# Patient Record
Sex: Female | Born: 1990 | Race: White | Hispanic: No | Marital: Single | Smoking: Current every day smoker
Health system: Southern US, Community
[De-identification: ages and names within clinical notes are randomized; demographics above are authoritative.]

## PROBLEM LIST (undated history)

## (undated) DIAGNOSIS — A4902 Methicillin resistant Staphylococcus aureus infection, unspecified site: Secondary | ICD-10-CM

## (undated) DIAGNOSIS — Z9851 Tubal ligation status: Secondary | ICD-10-CM

## (undated) DIAGNOSIS — O2300 Infections of kidney in pregnancy, unspecified trimester: Secondary | ICD-10-CM

## (undated) DIAGNOSIS — N390 Urinary tract infection, site not specified: Secondary | ICD-10-CM

## (undated) DIAGNOSIS — N12 Tubulo-interstitial nephritis, not specified as acute or chronic: Secondary | ICD-10-CM

## (undated) DIAGNOSIS — Z3400 Encounter for supervision of normal first pregnancy, unspecified trimester: Principal | ICD-10-CM

## (undated) DIAGNOSIS — F39 Unspecified mood [affective] disorder: Secondary | ICD-10-CM

## (undated) DIAGNOSIS — F419 Anxiety disorder, unspecified: Secondary | ICD-10-CM

## (undated) HISTORY — PX: CYSTOSCOPY: SUR368

## (undated) HISTORY — DX: Encounter for supervision of normal first pregnancy, unspecified trimester: Z34.00

---
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
## 2011-01-01 LAB — ABO/RH: RH Type: POSITIVE

## 2011-01-01 LAB — HIV ANTIBODY (ROUTINE TESTING W REFLEX): HIV: NONREACTIVE

## 2011-01-01 LAB — RUBELLA ANTIBODY, IGM: Rubella: IMMUNE

## 2011-01-15 ENCOUNTER — Inpatient Hospital Stay (HOSPITAL_COMMUNITY)
Admission: AD | Admit: 2011-01-15 | Discharge: 2011-01-17 | DRG: 781 | Disposition: A | Discharge status: Home / Self Care | Payer: Medicaid Other | Source: Ambulatory Visit | Attending: Obstetrics and Gynecology | Admitting: Obstetrics and Gynecology

## 2011-01-15 ENCOUNTER — Other Ambulatory Visit: Payer: Self-pay | Admitting: Obstetrics and Gynecology

## 2011-01-15 DIAGNOSIS — T370X5A Adverse effect of sulfonamides, initial encounter: Secondary | ICD-10-CM | POA: Diagnosis not present

## 2011-01-15 DIAGNOSIS — O2390 Unspecified genitourinary tract infection in pregnancy, unspecified trimester: Principal | ICD-10-CM | POA: Diagnosis present

## 2011-01-15 DIAGNOSIS — L500 Allergic urticaria: Secondary | ICD-10-CM | POA: Diagnosis not present

## 2011-01-15 DIAGNOSIS — N12 Tubulo-interstitial nephritis, not specified as acute or chronic: Secondary | ICD-10-CM | POA: Diagnosis present

## 2011-01-15 DIAGNOSIS — O2300 Infections of kidney in pregnancy, unspecified trimester: Secondary | ICD-10-CM

## 2011-01-15 LAB — CBC
MCH: 31.8 pg (ref 26.0–34.0)
MCHC: 35.8 g/dL (ref 30.0–36.0)
MCV: 88.9 fL (ref 78.0–100.0)
Platelets: 211 10*3/uL (ref 150–400)
RDW: 11.6 % (ref 11.5–15.5)

## 2011-01-15 LAB — BASIC METABOLIC PANEL
Calcium: 8.8 mg/dL (ref 8.4–10.5)
Creatinine, Ser: 0.47 mg/dL — ABNORMAL LOW (ref 0.50–1.10)
GFR calc Af Amer: >60 mL/min (ref >60)
GFR calc non Af Amer: >60 mL/min (ref >60)
Sodium: 135 mEq/L (ref 135–145)

## 2011-01-15 MED ORDER — SODIUM CHLORIDE 0.9 % IV SOLN
8.0000 mg | Freq: Three times a day (TID) | INTRAVENOUS | Status: DC | PRN
  Filled 2011-01-15: qty 4

## 2011-01-15 MED ORDER — ONDANSETRON HCL 4 MG PO TABS: 8.0000 mg | ORAL_TABLET | Freq: Three times a day (TID) | ORAL | Status: DC | PRN

## 2011-01-15 MED ORDER — MENTHOL 3 MG MT LOZG: 1.0000 | LOZENGE | OROMUCOSAL | Status: DC | PRN

## 2011-01-15 MED ORDER — OXYCODONE-ACETAMINOPHEN 5-325 MG PO TABS
1.0000 | ORAL_TABLET | ORAL | Status: DC | PRN
  Administered 2011-01-15: 1 via ORAL
  Administered 2011-01-16 (×2): 2 via ORAL
  Administered 2011-01-16: 1 via ORAL
  Administered 2011-01-16: 2 via ORAL
  Administered 2011-01-16 (×3): 1 via ORAL
  Filled 2011-01-15: qty 1
  Filled 2011-01-15 (×2): qty 2
  Filled 2011-01-15: qty 1
  Filled 2011-01-15: qty 2
  Filled 2011-01-15 (×2): qty 1

## 2011-01-15 MED ORDER — ALUM & MAG HYDROXIDE-SIMETH 200-200-20 MG/5ML PO SUSP
30.0000 mL | ORAL | Status: DC | PRN
  Filled 2011-01-15: qty 30

## 2011-01-15 MED ORDER — GENTAMICIN SULFATE 40 MG/ML IJ SOLN
Freq: Three times a day (TID) | INTRAVENOUS | Status: DC
  Administered 2011-01-15 – 2011-01-17 (×5): via INTRAVENOUS
  Filled 2011-01-15 (×7): qty 3.25

## 2011-01-15 MED ORDER — CLINDAMYCIN PHOSPHATE 900 MG/50ML IV SOLN
900.0000 mg | Freq: Three times a day (TID) | INTRAVENOUS | Status: DC
  Filled 2011-01-15: qty 50

## 2011-01-15 MED ORDER — LACTATED RINGERS IV SOLN
INTRAVENOUS | Status: DC
  Administered 2011-01-15 – 2011-01-17 (×5): via INTRAVENOUS

## 2011-01-15 MED ORDER — DOCUSATE SODIUM 100 MG PO CAPS
100.0000 mg | ORAL_CAPSULE | Freq: Every day | ORAL | Status: DC
  Administered 2011-01-15 – 2011-01-17 (×4): 100 mg via ORAL
  Filled 2011-01-15 (×4): qty 1

## 2011-01-15 MED ORDER — SIMETHICONE 80 MG PO CHEW: 80.0000 mg | CHEWABLE_TABLET | Freq: Four times a day (QID) | ORAL | Status: DC | PRN

## 2011-01-15 MED ORDER — GUAIFENESIN 100 MG/5ML PO SOLN: 15.0000 mL | ORAL | Status: DC | PRN

## 2011-01-15 MED ORDER — ZOLPIDEM TARTRATE 5 MG PO TABS: 5.0000 mg | ORAL_TABLET | Freq: Every evening | ORAL | Status: DC | PRN

## 2011-01-15 NOTE — H&P (Signed)
[name] [name] is an 20 y.o. female. G1P0 at 15+3 with likely pyelonephritis.  No LOF,no VB, no ctx; c/o flank pain, chills, nausea, vomiting, weight loss.  Pt afebrile in office. Also c/o HA.    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 Augmentin Hives and Swelling  . Penicillins Hives and Swelling  SH: denies tobacco, etoh, drug use; single, GED FH: Alcohol, Arthritis, Bipolar, Cervical CA, Depression  Review of Systems  Constitutional: Negative.   HENT: Negative.   Respiratory: Negative.   Cardiovascular: Negative.   Gastrointestinal: Positive for nausea, vomiting and abdominal pain.  Genitourinary: Positive for dysuria, urgency, frequency and flank pain.  Musculoskeletal: Positive for myalgias.  Skin: Negative.   Neurological: Positive for dizziness.       Headache    AF VSS Physical Exam  Constitutional: She is oriented to person, place, and time. She appears well-developed and well-nourished.       Ill appearing  HENT:  Head: Normocephalic and atraumatic.  Eyes: Pupils are equal, round, and reactive to light.  Neck: Normal range of motion. Neck supple.  Cardiovascular: Regular rhythm.   Respiratory: Effort normal and breath sounds normal.  GI: Soft. Bowel sounds are normal. She exhibits no distension. There is no tenderness.  Neurological: She is alert and oriented to person, place, and time.  Skin: Skin is warm and dry.  Psychiatric: She has a normal mood and affect.     Assessment/Plan: [name] with pyelo. Admit for abx - gentamicin and clindamycin (secondary to PCN allergy) Daily FHTs Pain med for HA Nausea med for N/V   [name] [name] 01/15/2011, 5:38 PM

## 2011-01-15 NOTE — Consult Note (Signed)
ANTIBIOTIC CONSULT NOTE - INITIAL  Pharmacy Consult for gentamicin Indication: pyelonephritis  Patient Measurements: Height: 5\' 3"  (160 cm) Weight: 113 lb (51.256 kg) IBW/kg (Calculated) : 52.4    Vital Signs: Temp: 98.4 F (36.9 C) (08/03 1900) Temp src: Oral (08/03 1900) BP: 108/71 mmHg (08/03 1900) Pulse Rate: 93  (08/03 1900) Intake/Output from this shift:  Not charted  Labs:  [facility] 01/15/11 2033  WBC 8.0  HGB 12.0  PLT 211  LABCREA --  CREATININE 0.47*  CRCLEARANCE --   No results found for this basename: VANCOTROUGH:2,VANCOPEAK:2,VANCORANDOM:2,GENTTROUGH:2,GENTPEAK:2,GENTRANDOM:2,TOBRATROUGH:2,TOBRAPEAK:2,TOBRARND:2,AMIKACINPEAK:2,AMIKACINTROU:2,AMIKACIN:2, in the last 72 hours   Microbiology: No results found for this or any previous visit (from the past 720 hour(s)).  Medical History: Anxiety, mood disorder, h/o pyelonephritis, agenesis of ureter- surgically repaired DOL 3.   Medications:  Clindamycin 900 mg IV Q8H  Assessment: 20 yo,  [redacted] weeks pregnant, likely pyelonephritis.   Goal of Therapy:  Gentamicin goal peak = 6-8 and trough <1  Plan:  Gentamicin 130mg  IV Q8H.  Will align schedule with clindamycin to be given together.   [name] [name] 01/15/2011,9:20 PM

## 2011-01-16 NOTE — Progress Notes (Signed)
Subjective: Patient reports feeling some better.  Her HA has resolved with Percocet, sounds like a migraine triggered from dehydration. Tolerating small amounts of po intake with no N/V. Minimal back pain.  Still occasional chills.    Objective: I have reviewed patient's vital signs and labs.  General: alert Resp: clear to auscultation bilaterally Cardio: regular rate and rhythm GI: soft, non-tender; bowel sounds normal; no masses,  no organomegaly Vaginal Bleeding: none   Assessment/Plan: Pt 15+ weeks gestation with presumed pyelonephritis by clinical presentation.  On gent and clinda day 1/2.  No fever noted,  WBC WNL.  Was under treatment for UTI with Bactrim, developed hives on that.  Creatinine WNL.  Will continue full 24 hours of Abx, if clinically improved, plan d/c tomorrow.  Cx done in office still pending.  LOS: 1 day    Gio Janoski W 01/16/2011, 10:09 AM

## 2011-01-17 LAB — URINE CULTURE
Colony Count: 10000
Culture  Setup Time: 201208040451

## 2011-01-17 MED ORDER — OXYCODONE-ACETAMINOPHEN 5-325 MG PO TABS
1.0000 | ORAL_TABLET | Freq: Four times a day (QID) | ORAL | Status: AC | PRN
Start: 2011-01-17 — End: 2011-01-27

## 2011-01-17 MED ORDER — NITROFURANTOIN MONOHYD MACRO 100 MG PO CAPS: 100.0000 mg | ORAL_CAPSULE | Freq: Two times a day (BID) | ORAL | Status: AC

## 2011-01-17 NOTE — Progress Notes (Addendum)
Subjective: Patient reports feeling some better.  Still has some HA and back pain for which she is taking Percocet prn.  Tolerated po Intake yesterday with one episode of emesis after took percocet on empty stomach      Objective: I have reviewed patient's vital signs, labs and microbiology.  She has had no fever since admission and white count has been WNL. Her hospital urine cx showed mixed bacteria, 16109 colonies (but had been on Bactrim)  General: alert Resp: clear to auscultation bilaterally Cardio: regular rate and rhythm GI: soft, non-tender; bowel sounds normal; no masses,  no organomegaly   Assessment/Plan: Pt now s/p more than 24 hours of gent/clinda with no fever and no WBC elevation.  Still states does not feel totally well, So will change to po Macrobid and d/c home on that.  Advised to wean her percocet.  Will send her home with 15 only.   LOS: 2 days    Maxton Noreen W 01/17/2011, 9:16 AM

## 2011-01-17 NOTE — Discharge Summary (Signed)
Physician Discharge Summary  Patient ID: [name] [name] MRN: [id number] DOB/AGE: [birth year] 19 y.o.  Admit date: 01/15/2011 Discharge date: 01/17/2011  Admission Diagnoses: [redacted] weeks gestation                                         Clinical pyelonephritis with h/o urological surgery  Discharge Diagnoses: same   Discharged Condition: good  Hospital Course: Pt was admitted from office with presumed pyelonephritis c/o chills and CVA pain.  Had been on Bactrim for a UTI for 2 days prior to admission  But still did not feel well with decreased po intake and HA.  Also developed hives on Bactrim.  Admitted with over 24 hours of gentamicin and clindamycin.  Never had a temperature in hospital. WBC and culture WNL.  On day of d/c pt still c/o some back pain and HA, but controlled with po percocet, and weaning those.   Treatments: IV antibiotic therapy (gentamicin and clindamycin)  Discharge Exam: Blood pressure 94/56, pulse 79, temperature 98.9 F (37.2 C), temperature source Oral, resp. rate 18, height 5\' 3"  (1.6 m), weight 51.256 kg (113 lb), SpO2 100.00%. General appearance: alert Cardio: regular rate and rhythm GI: soft, non-tender; bowel sounds normal; no masses,  no organomegaly  Disposition: Home or Self Care  Discharge Orders    Future Orders Please Complete By Expires   Diet - low sodium heart healthy      Increase activity slowly      Discharge instructions      Comments:   Continue to drink fluids and eat small frequent meals.   May shower / Bathe      Call MD for:  temperature >100.4      Call MD for:  severe uncontrolled pain      Call MD for:  persistant nausea and vomiting        Current Discharge Medication List    CONTINUE these medications which have NOT CHANGED   Details  acetaminophen (TYLENOL) 500 MG tablet Take 1,000 mg by mouth every 6 (six) hours as needed.      hydrocortisone 1 % cream Apply 1 application topically 2 (two) times daily. As needed for rash      prenatal vitamin w/FE, FA (PRENATAL 1 + 1) 27-1 MG TABS Take 1 tablet by mouth daily.      sulfamethoxazole-trimethoprim (BACTRIM DS) 800-160 MG per tablet Take 1 tablet by mouth 2 (two) times daily. Take for 7 days, started yesterday        Follow-up Information    Follow up with BOVARD,JODY, MD in 1 week. (call for appt)    Contact information:   [address] [facility] [address] [place] [address] [phone]          Signed: Oliver Pila 01/17/2011, 9:39 AM

## 2011-03-11 LAB — SALICYLATE LEVEL: Salicylate Lvl: <4

## 2011-03-11 LAB — POCT I-STAT, CHEM 8
BUN: 14
Calcium, Ion: 1.17
Chloride: 103
Chloride: 104
Glucose, Bld: 92
HCT: 46 — ABNORMAL HIGH
Potassium: 4.1
Potassium: 4.4
Sodium: 138

## 2011-03-11 LAB — COMPREHENSIVE METABOLIC PANEL
AST: 20
Albumin: 4.7
BUN: 16
Chloride: 102
Glucose, Bld: 94
Potassium: 4.4
Sodium: 138

## 2011-03-11 LAB — WET PREP, GENITAL: Yeast Wet Prep HPF POC: NONE SEEN

## 2011-03-11 LAB — ACETAMINOPHEN LEVEL
Acetaminophen (Tylenol), Serum: <10 — ABNORMAL LOW
Acetaminophen (Tylenol), Serum: <10 — ABNORMAL LOW

## 2011-03-11 LAB — URINALYSIS, ROUTINE W REFLEX MICROSCOPIC
Hgb urine dipstick: NEGATIVE
Nitrite: NEGATIVE
Protein, ur: NEGATIVE
Specific Gravity, Urine: 1.01
Urobilinogen, UA: 0.2

## 2011-03-11 LAB — URINE CULTURE: Culture: NO GROWTH

## 2011-03-11 LAB — CBC
HCT: 45 — ABNORMAL HIGH
Hemoglobin: 15.2 — ABNORMAL HIGH
RBC: 4.92
WBC: 6.4

## 2011-03-11 LAB — DIFFERENTIAL
Eosinophils Relative: 4
Lymphocytes Relative: 52
Lymphs Abs: 3.3

## 2011-03-11 LAB — RAPID URINE DRUG SCREEN, HOSP PERFORMED
Cocaine: NOT DETECTED
Opiates: POSITIVE — AB
Tetrahydrocannabinol: POSITIVE — AB
Tetrahydrocannabinol: POSITIVE — AB

## 2011-03-11 LAB — POCT PREGNANCY, URINE
Operator id: 294511
Preg Test, Ur: NEGATIVE

## 2011-04-20 ENCOUNTER — Emergency Department (HOSPITAL_COMMUNITY)
Admission: EM | Admit: 2011-04-20 | Discharge: 2011-04-20 | Discharge status: Against Medical Advice | Payer: Medicaid Other | Attending: Emergency Medicine | Admitting: Emergency Medicine

## 2011-04-20 DIAGNOSIS — M25529 Pain in unspecified elbow: Secondary | ICD-10-CM | POA: Insufficient documentation

## 2011-05-31 ENCOUNTER — Encounter (HOSPITAL_COMMUNITY): Payer: Self-pay

## 2011-05-31 ENCOUNTER — Inpatient Hospital Stay (HOSPITAL_COMMUNITY)
Admission: AD | Admit: 2011-05-31 | Discharge: 2011-05-31 | Disposition: A | Discharge status: Home / Self Care | Payer: Medicaid Other | Source: Ambulatory Visit | Attending: Obstetrics and Gynecology | Admitting: Obstetrics and Gynecology

## 2011-05-31 DIAGNOSIS — J029 Acute pharyngitis, unspecified: Secondary | ICD-10-CM | POA: Insufficient documentation

## 2011-05-31 DIAGNOSIS — Z3480 Encounter for supervision of other normal pregnancy, unspecified trimester: Secondary | ICD-10-CM

## 2011-05-31 DIAGNOSIS — O212 Late vomiting of pregnancy: Secondary | ICD-10-CM | POA: Insufficient documentation

## 2011-05-31 DIAGNOSIS — O99891 Other specified diseases and conditions complicating pregnancy: Secondary | ICD-10-CM | POA: Insufficient documentation

## 2011-05-31 HISTORY — DX: Methicillin resistant Staphylococcus aureus infection, unspecified site: A49.02

## 2011-05-31 HISTORY — DX: Anxiety disorder, unspecified: F41.9

## 2011-05-31 HISTORY — DX: Infections of kidney in pregnancy, unspecified trimester: O23.00

## 2011-05-31 HISTORY — DX: Urinary tract infection, site not specified: N39.0

## 2011-05-31 LAB — URINALYSIS, ROUTINE W REFLEX MICROSCOPIC
Glucose, UA: NEGATIVE mg/dL
Nitrite: NEGATIVE
Specific Gravity, Urine: >1.03 — ABNORMAL HIGH (ref 1.005–1.030)
pH: 6 (ref 5.0–8.0)

## 2011-05-31 LAB — COMPREHENSIVE METABOLIC PANEL
ALT: 14 U/L (ref 0–35)
AST: 16 U/L (ref 0–37)
Calcium: 8.3 mg/dL — ABNORMAL LOW (ref 8.4–10.5)
Creatinine, Ser: 0.59 mg/dL (ref 0.50–1.10)
GFR calc Af Amer: >90 mL/min (ref >90)
GFR calc non Af Amer: >90 mL/min (ref >90)
Sodium: 135 mEq/L (ref 135–145)
Total Protein: 6.6 g/dL (ref 6.0–8.3)

## 2011-05-31 LAB — CBC
MCH: 30.8 pg (ref 26.0–34.0)
MCHC: 34 g/dL (ref 30.0–36.0)
MCV: 90.5 fL (ref 78.0–100.0)
Platelets: 203 10*3/uL (ref 150–400)

## 2011-05-31 LAB — URINE MICROSCOPIC-ADD ON

## 2011-05-31 MED ORDER — DEXTROSE 5 % IN LACTATED RINGERS IV BOLUS
1000.0000 mL | Freq: Once | INTRAVENOUS | Status: AC
  Administered 2011-05-31: 1000 mL via INTRAVENOUS

## 2011-05-31 MED ORDER — ACETAMINOPHEN 500 MG PO TABS
1000.0000 mg | ORAL_TABLET | Freq: Once | ORAL | Status: AC
  Administered 2011-05-31: 1000 mg via ORAL
  Filled 2011-05-31: qty 2

## 2011-05-31 MED ORDER — ERYTHROMYCIN BASE 500 MG PO TABS: 250.0000 mg | ORAL_TABLET | Freq: Four times a day (QID) | ORAL | Status: DC

## 2011-05-31 MED ORDER — ERYTHROMYCIN BASE 500 MG PO TABS: 250.0000 mg | ORAL_TABLET | Freq: Four times a day (QID) | ORAL | Status: AC

## 2011-05-31 NOTE — Progress Notes (Signed)
Pt states started getting sore throat 2 days ago, awoke yesterday hot and nauseated. Started vomiting, low grade fever last pm. Unable to keep fluids down. Has h/a, hot/cold chills. Denies bleeding or vaginal d/c changes. +Fm. Also has cough.

## 2011-05-31 NOTE — ED Provider Notes (Signed)
History   Pt presents today c/o sore throat, N&V for the past couple of days. She states her highest temp has been 99.7. She denies diarrhea. She is currently 34.[redacted]wks pregnant and states she has had difficulty keeping any fluids on her stomach for the past 12 hours.  Chief Complaint  Patient presents with  . Dehydration   HPI  OB History    Grav Para Term Preterm Abortions TAB SAB Ect Mult Living   1               Past Medical History  Diagnosis Date  . Urinary tract infection   . Pyelonephritis complicating pregnancy   . MRSA (methicillin resistant Staphylococcus aureus) 2011  . Anxiety     Past Surgical History  Procedure Date  . Cystoscopy     ? not sure, had blocked ureter at age 59wks    Family History  Problem Relation Age of Onset  . Anesthesia problems Neg Hx     History  Substance Use Topics  . Smoking status: Former Games developer  . Smokeless tobacco: Never Used  . Alcohol Use: Yes     prior to preg    Allergies:  Allergies  Allergen Reactions  . Augmentin Anaphylaxis and Hives  . Bactrim Hives  . Penicillins Hives    Prescriptions prior to admission  Medication Sig Dispense Refill  . acetaminophen (TYLENOL) 500 MG tablet Take 1,000 mg by mouth every 6 (six) hours as needed. pain       . prenatal vitamin w/FE, FA (PRENATAL 1 + 1) 27-1 MG TABS Take 1 tablet by mouth at bedtime.          Review of Systems  Constitutional: Negative for fever.  Eyes: Negative for blurred vision.  Respiratory: Positive for cough.   Cardiovascular: Negative for chest pain and palpitations.  Gastrointestinal: Positive for nausea and vomiting. Negative for abdominal pain, diarrhea and constipation.  Genitourinary: Negative for dysuria, urgency, frequency and hematuria.  Neurological: Negative for dizziness and headaches.  Psychiatric/Behavioral: Negative for depression and suicidal ideas.   Physical Exam   Blood pressure 109/65, pulse 99, temperature 98.3 F (36.8 C),  temperature source Oral, resp. rate 18, height 5\' 3"  (1.6 m), weight 140 lb 6 oz (63.674 kg), SpO2 97.00%.  Physical Exam  Nursing note and vitals reviewed. Constitutional: She is oriented to person, place, and time. She appears well-developed and well-nourished. No distress.  HENT:  Head: Normocephalic and atraumatic.  Mouth/Throat: Oropharyngeal exudate present.  Eyes: EOM are normal. Pupils are equal, round, and reactive to light.  Cardiovascular: Normal rate, regular rhythm and normal heart sounds.  Exam reveals no gallop and no friction rub.   No murmur heard. Respiratory: Effort normal and breath sounds normal. No respiratory distress. She has no wheezes. She has no rales. She exhibits no tenderness.  GI: Soft. She exhibits no distension. There is no tenderness. There is no rebound and no guarding.  Neurological: She is alert and oriented to person, place, and time.  Skin: Skin is warm and dry. She is not diaphoretic.  Psychiatric: She has a normal mood and affect. Her behavior is normal. Judgment and thought content normal.    MAU Course  Procedures  Discussed pt with Dr. Ambrose Mantle.  Results for orders placed during the hospital encounter of 05/31/11 (from the past 24 hour(s))  URINALYSIS, ROUTINE W REFLEX MICROSCOPIC     Status: Abnormal   Collection Time   05/31/11  2:00 PM  Component Value Range   Color, Urine YELLOW  YELLOW    APPearance HAZY (*) CLEAR    Specific Gravity, Urine >1.030 (*) 1.005 - 1.030    pH 6.0  5.0 - 8.0    Glucose, UA NEGATIVE  NEGATIVE (mg/dL)   Hgb urine dipstick TRACE (*) NEGATIVE    Bilirubin Urine SMALL (*) NEGATIVE    Ketones, ur >80 (*) NEGATIVE (mg/dL)   Protein, ur 30 (*) NEGATIVE (mg/dL)   Urobilinogen, UA 0.2  0.0 - 1.0 (mg/dL)   Nitrite NEGATIVE  NEGATIVE    Leukocytes, UA MODERATE (*) NEGATIVE   URINE MICROSCOPIC-ADD ON     Status: Abnormal   Collection Time   05/31/11  2:00 PM      Component Value Range   Squamous Epithelial /  LPF MANY (*) RARE    WBC, UA 21-50  <3 (WBC/hpf)   RBC / HPF 7-10  <3 (RBC/hpf)   Bacteria, UA MANY (*) RARE    Urine-Other MUCOUS PRESENT    CBC     Status: Abnormal   Collection Time   05/31/11  3:35 PM      Component Value Range   WBC 10.8 (*) 4.0 - 10.5 (K/uL)   RBC 3.38 (*) 3.87 - 5.11 (MIL/uL)   Hemoglobin 10.4 (*) 12.0 - 15.0 (g/dL)   HCT 11.9 (*) 14.7 - 46.0 (%)   MCV 90.5  78.0 - 100.0 (fL)   MCH 30.8  26.0 - 34.0 (pg)   MCHC 34.0  30.0 - 36.0 (g/dL)   RDW 82.9  56.2 - 13.0 (%)   Platelets 203  150 - 400 (K/uL)  COMPREHENSIVE METABOLIC PANEL     Status: Abnormal   Collection Time   05/31/11  3:35 PM      Component Value Range   Sodium 135  135 - 145 (mEq/L)   Potassium 3.5  3.5 - 5.1 (mEq/L)   Chloride 101  96 - 112 (mEq/L)   CO2 22  19 - 32 (mEq/L)   Glucose, Bld 146 (*) 70 - 99 (mg/dL)   BUN 8  6 - 23 (mg/dL)   Creatinine, Ser 8.65  0.50 - 1.10 (mg/dL)   Calcium 8.3 (*) 8.4 - 10.5 (mg/dL)   Total Protein 6.6  6.0 - 8.3 (g/dL)   Albumin 2.9 (*) 3.5 - 5.2 (g/dL)   AST 16  0 - 37 (U/L)   ALT 14  0 - 35 (U/L)   Alkaline Phosphatase 88  39 - 117 (U/L)   Total Bilirubin 0.4  0.3 - 1.2 (mg/dL)   GFR calc non Af Amer >90  >90 (mL/min)   GFR calc Af Amer >90  >90 (mL/min)     Assessment and Plan  Pharyngitis: discussed with pt at length. Will give Rx for erythromycin per Dr. Ambrose Mantle. She has f/u scheduled. Discussed diet, activity, risks, and precautions.  Clinton Gallant. Rice III, DrHSc, MPAS, PA-C  05/31/2011, 3:32 PM   Henrietta Hoover, PA 05/31/11 1811

## 2011-05-31 NOTE — Progress Notes (Signed)
N/v started last night.  Really nauseous past 2 days.  Sore throat, coughing past 2 days. Back pain today.  Highest temp 99.7. No diarrhea.

## 2011-06-06 ENCOUNTER — Encounter (HOSPITAL_COMMUNITY): Payer: Self-pay

## 2011-06-06 ENCOUNTER — Inpatient Hospital Stay (HOSPITAL_COMMUNITY)
Admission: AD | Admit: 2011-06-06 | Discharge: 2011-06-06 | Disposition: A | Discharge status: Home / Self Care | Payer: Medicaid Other | Source: Ambulatory Visit | Attending: Obstetrics and Gynecology | Admitting: Obstetrics and Gynecology

## 2011-06-06 DIAGNOSIS — O212 Late vomiting of pregnancy: Secondary | ICD-10-CM | POA: Insufficient documentation

## 2011-06-06 DIAGNOSIS — R079 Chest pain, unspecified: Secondary | ICD-10-CM | POA: Insufficient documentation

## 2011-06-06 DIAGNOSIS — O219 Vomiting of pregnancy, unspecified: Secondary | ICD-10-CM

## 2011-06-06 HISTORY — DX: Unspecified mood (affective) disorder: F39

## 2011-06-06 HISTORY — DX: Tubulo-interstitial nephritis, not specified as acute or chronic: N12

## 2011-06-06 LAB — URINE MICROSCOPIC-ADD ON

## 2011-06-06 LAB — URINALYSIS, ROUTINE W REFLEX MICROSCOPIC
Nitrite: NEGATIVE
Specific Gravity, Urine: 1.02 (ref 1.005–1.030)
Urobilinogen, UA: 1 mg/dL (ref 0.0–1.0)
pH: 6.5 (ref 5.0–8.0)

## 2011-06-06 MED ORDER — GI COCKTAIL ~~LOC~~
30.0000 mL | Freq: Once | ORAL | Status: AC
  Administered 2011-06-06: 30 mL via ORAL
  Filled 2011-06-06: qty 30

## 2011-06-06 MED ORDER — SODIUM CHLORIDE 0.9 % IV SOLN
25.0000 mg | Freq: Once | INTRAVENOUS | Status: AC
  Administered 2011-06-06: 25 mg via INTRAVENOUS
  Filled 2011-06-06: qty 1

## 2011-06-06 MED ORDER — LACTATED RINGERS IV SOLN
INTRAVENOUS | Status: DC
  Administered 2011-06-06: 05:00:00 via INTRAVENOUS

## 2011-06-06 MED ORDER — ONDANSETRON 8 MG PO TBDP: 8.0000 mg | ORAL_TABLET | Freq: Three times a day (TID) | ORAL | Status: AC | PRN

## 2011-06-06 NOTE — ED Provider Notes (Signed)
History     Chief Complaint  Patient presents with  . Nausea  . Emesis  . Chest Pain   HPI This is a 20 y.o. female who presents at [redacted]w[redacted]d with C/O vomiting and coughing. Has not taken anything for vomiting. Treated for sore throat several days ago but did not get antibiotic yet, as it is on order. Per RN: Patient is here with c/o n/v for a few days with chest pain which describes as bubbles in her chest. She states that she couldn't keep anything down since 2200pm. She also c/o cramps. Denies any vaginal bleeding. She states that has yellow vaginal discharge. She states that she was dx with strep last week but stopped the z-pack because it made her vomit.      Medical history as below but also has history of drug abuse and overdose in past 2 years.   OB History    Grav Para Term Preterm Abortions TAB SAB Ect Mult Living   1               Past Medical History  Diagnosis Date  . Urinary tract infection   . Pyelonephritis complicating pregnancy   . MRSA (methicillin resistant Staphylococcus aureus) 2011  . Anxiety   . Mood disorder   . Pyelonephritis     Past Surgical History  Procedure Date  . Cystoscopy     ? not sure, had blocked ureter at age 10wks    Family History  Problem Relation Age of Onset  . Anesthesia problems Neg Hx     History  Substance Use Topics  . Smoking status: Former Games developer  . Smokeless tobacco: Never Used  . Alcohol Use: Yes     prior to preg    Allergies:  Allergies  Allergen Reactions  . Augmentin Anaphylaxis and Hives  . Bactrim Hives  . Penicillins Hives    Prescriptions prior to admission  Medication Sig Dispense Refill  . acetaminophen (TYLENOL) 500 MG tablet Take 1,000 mg by mouth every 6 (six) hours as needed. pain       . calcium carbonate (TUMS - DOSED IN MG ELEMENTAL CALCIUM) 500 MG chewable tablet Chew 1 tablet by mouth 2 (two) times daily as needed.        Marland Kitchen erythromycin base (E-MYCIN) 500 MG tablet Take 0.5 tablets (250  mg total) by mouth 4 (four) times daily.  20 tablet  0  . guaiFENesin (ROBITUSSIN) 100 MG/5ML SOLN Take 5 mLs by mouth every 4 (four) hours as needed.        . prenatal vitamin w/FE, FA (PRENATAL 1 + 1) 27-1 MG TABS Take 1 tablet by mouth at bedtime.          ROS As above  Physical Exam   Height 5\' 3"  (1.6 m), weight 140 lb 8 oz (63.73 kg).  Physical Exam  Constitutional: She is oriented to person, place, and time. She appears well-developed.       Actively vomiting and coughing. Appears uncomfortable.   HENT:  Head: Normocephalic.  Cardiovascular: Normal rate, regular rhythm and normal heart sounds.   Respiratory: Effort normal and breath sounds normal. No respiratory distress. She has no wheezes. She has no rales.  GI: Soft. She exhibits no distension and no mass. There is tenderness (mildly tender throughout). There is no rebound and no guarding.  Genitourinary: Vagina normal and uterus normal. No vaginal discharge found.       Cervix tight 1cm/60&/-2/vtx FHR reassuring with irregular  contractions  Musculoskeletal: Normal range of motion.  Neurological: She is alert and oriented to person, place, and time.  Skin: Skin is warm and dry.  Psychiatric: She has a normal mood and affect.    MAU Course  Procedures Will give IV Phenergan and then try a dose of GI coctail for presumed esophagitis per consult Dr Ambrose Mantle  0500 am.:  Feels better after IV and Coctail.   WIll give one more liter of plain LR to hydrate her fully then will d/c home.  Assessment and Plan  A:  Vomiting       Pregnancy at [redacted]w[redacted]d P:  D/C home       Rx Zofran ODT prn use      Followup in office  [facility] 06/06/2011, 4:16 AM

## 2011-06-06 NOTE — Progress Notes (Signed)
Patient is here with c/o n/v for a few days with chest pain which describes as bubbles in her chest. She states that she couldn't keep anything down since 2200pm. She also c/o cramps. Denies any vaginal bleeding. She states that has yellow vaginal discharge. She states that she was dx with strep last week but stopped the z-pack because it made her vomit.

## 2011-06-13 ENCOUNTER — Inpatient Hospital Stay (HOSPITAL_COMMUNITY)
Admission: AD | Admit: 2011-06-13 | Discharge: 2011-06-13 | Disposition: A | Discharge status: Home / Self Care | Payer: Medicaid Other | Source: Ambulatory Visit | Attending: Obstetrics and Gynecology | Admitting: Obstetrics and Gynecology

## 2011-06-13 ENCOUNTER — Encounter (HOSPITAL_COMMUNITY): Payer: Self-pay | Admitting: *Deleted

## 2011-06-13 DIAGNOSIS — O479 False labor, unspecified: Secondary | ICD-10-CM | POA: Insufficient documentation

## 2011-06-13 NOTE — Progress Notes (Signed)
Pt states she has had contractions for the last 2 hours

## 2011-06-13 NOTE — Progress Notes (Signed)
Pt contracting for the past two hours with ctx btwn 2-7 min apart.  No bleeding or leaking.

## 2011-06-15 NOTE — L&D Delivery Note (Signed)
Delivery Note At 5:25 PM a viable female was delivered via Vaginal, Spontaneous Delivery (Presentation: ; Occiput Anterior).  APGAR: 8, 9; weight 8 lb 8 oz (3855 g).   Placenta status: Intact, Spontaneous.  Cord: 3 vessels with the following complications: None.    Anesthesia: Epidural  Episiotomy: N/A Lacerations: Labial B Suture Repair: 3.0 vicryl rapide Est. Blood Loss (mL): 300  Mom to postpartum.  Baby to nursery-stable.  BOVARD,Lashawne Dura 07/07/2011, 5:52 PM  Br/RI/A+/Implanon PP Desires circumcision, d/w pt R/B/A wish to proceed.  Will pay and schedule in office.

## 2011-06-17 LAB — STREP B DNA PROBE: GBS: NEGATIVE

## 2011-06-17 LAB — GC/CHLAMYDIA PROBE AMP, GENITAL
Chlamydia: NEGATIVE
Gonorrhea: NEGATIVE

## 2011-07-01 ENCOUNTER — Encounter (HOSPITAL_COMMUNITY): Payer: Self-pay | Admitting: *Deleted

## 2011-07-01 ENCOUNTER — Telehealth (HOSPITAL_COMMUNITY): Payer: Self-pay | Admitting: *Deleted

## 2011-07-01 NOTE — Telephone Encounter (Signed)
Preadmission screen  

## 2011-07-02 ENCOUNTER — Encounter (HOSPITAL_COMMUNITY): Payer: Self-pay | Admitting: *Deleted

## 2011-07-02 ENCOUNTER — Telehealth (HOSPITAL_COMMUNITY): Payer: Self-pay | Admitting: *Deleted

## 2011-07-02 NOTE — Telephone Encounter (Signed)
Preadmission screen  

## 2011-07-06 ENCOUNTER — Other Ambulatory Visit: Payer: Self-pay | Admitting: Obstetrics and Gynecology

## 2011-07-06 ENCOUNTER — Encounter: Payer: Self-pay | Admitting: Obstetrics and Gynecology

## 2011-07-06 DIAGNOSIS — Z3400 Encounter for supervision of normal first pregnancy, unspecified trimester: Secondary | ICD-10-CM | POA: Insufficient documentation

## 2011-07-06 HISTORY — DX: Encounter for supervision of normal first pregnancy, unspecified trimester: Z34.00

## 2011-07-07 ENCOUNTER — Encounter (HOSPITAL_COMMUNITY): Payer: Self-pay

## 2011-07-07 ENCOUNTER — Inpatient Hospital Stay (HOSPITAL_COMMUNITY)
Admission: RE | Admit: 2011-07-07 | Discharge: 2011-07-09 | DRG: 775 | Disposition: A | Discharge status: Home / Self Care | Payer: Medicaid Other | Source: Ambulatory Visit | Attending: Obstetrics and Gynecology | Admitting: Obstetrics and Gynecology

## 2011-07-07 ENCOUNTER — Encounter (HOSPITAL_COMMUNITY): Payer: Self-pay | Admitting: Anesthesiology

## 2011-07-07 ENCOUNTER — Inpatient Hospital Stay (HOSPITAL_COMMUNITY): Payer: Medicaid Other | Admitting: Anesthesiology

## 2011-07-07 LAB — MRSA PCR SCREENING: MRSA by PCR: NEGATIVE

## 2011-07-07 LAB — CBC
HCT: 30.7 % — ABNORMAL LOW (ref 36.0–46.0)
MCHC: 33.6 g/dL (ref 30.0–36.0)
RDW: 13.6 % (ref 11.5–15.5)

## 2011-07-07 MED ORDER — WITCH HAZEL-GLYCERIN EX PADS: 1.0000 "application " | MEDICATED_PAD | CUTANEOUS | Status: DC | PRN

## 2011-07-07 MED ORDER — LIDOCAINE HCL (PF) 1 % IJ SOLN
30.0000 mL | INTRAMUSCULAR | Status: DC | PRN
  Filled 2011-07-07: qty 30

## 2011-07-07 MED ORDER — OXYTOCIN 20 UNITS IN LACTATED RINGERS INFUSION - SIMPLE
1.0000 m[IU]/min | INTRAVENOUS | Status: DC
  Administered 2011-07-07: 2 m[IU]/min via INTRAVENOUS
  Filled 2011-07-07: qty 1000

## 2011-07-07 MED ORDER — PRENATAL PLUS 27-1 MG PO TABS: 1.0000 | ORAL_TABLET | Freq: Every day | ORAL | Status: DC

## 2011-07-07 MED ORDER — TERBUTALINE SULFATE 1 MG/ML IJ SOLN: 0.2500 mg | Freq: Once | INTRAMUSCULAR | Status: DC | PRN

## 2011-07-07 MED ORDER — LACTATED RINGERS IV SOLN
INTRAVENOUS | Status: DC
  Administered 2011-07-07 (×2): via INTRAVENOUS

## 2011-07-07 MED ORDER — CITRIC ACID-SODIUM CITRATE 334-500 MG/5ML PO SOLN: 30.0000 mL | ORAL | Status: DC | PRN

## 2011-07-07 MED ORDER — SENNOSIDES-DOCUSATE SODIUM 8.6-50 MG PO TABS
2.0000 | ORAL_TABLET | Freq: Every day | ORAL | Status: DC
  Administered 2011-07-07 – 2011-07-08 (×2): 2 via ORAL

## 2011-07-07 MED ORDER — OXYTOCIN BOLUS FROM INFUSION
500.0000 mL | Freq: Once | INTRAVENOUS | Status: DC
  Filled 2011-07-07: qty 500

## 2011-07-07 MED ORDER — IBUPROFEN 600 MG PO TABS
600.0000 mg | ORAL_TABLET | Freq: Four times a day (QID) | ORAL | Status: DC
  Administered 2011-07-08 – 2011-07-09 (×6): 600 mg via ORAL
  Filled 2011-07-07 (×6): qty 1

## 2011-07-07 MED ORDER — SODIUM BICARBONATE 8.4 % IV SOLN
INTRAVENOUS | Status: DC | PRN
  Administered 2011-07-07: 4 mL via EPIDURAL

## 2011-07-07 MED ORDER — CALCIUM CARBONATE ANTACID 500 MG PO CHEW: 1.0000 | CHEWABLE_TABLET | Freq: Two times a day (BID) | ORAL | Status: DC | PRN

## 2011-07-07 MED ORDER — OXYTOCIN 20 UNITS IN LACTATED RINGERS INFUSION - SIMPLE: 125.0000 mL/h | Freq: Once | INTRAVENOUS | Status: DC

## 2011-07-07 MED ORDER — DIPHENHYDRAMINE HCL 25 MG PO CAPS: 25.0000 mg | ORAL_CAPSULE | Freq: Four times a day (QID) | ORAL | Status: DC | PRN

## 2011-07-07 MED ORDER — PRENATAL MULTIVITAMIN CH
1.0000 | ORAL_TABLET | Freq: Every day | ORAL | Status: DC
  Administered 2011-07-07 – 2011-07-09 (×3): 1 via ORAL
  Filled 2011-07-07 (×3): qty 1

## 2011-07-07 MED ORDER — DIBUCAINE 1 % RE OINT: 1.0000 "application " | TOPICAL_OINTMENT | RECTAL | Status: DC | PRN

## 2011-07-07 MED ORDER — SIMETHICONE 80 MG PO CHEW: 80.0000 mg | CHEWABLE_TABLET | ORAL | Status: DC | PRN

## 2011-07-07 MED ORDER — BENZOCAINE-MENTHOL 20-0.5 % EX AERO
INHALATION_SPRAY | CUTANEOUS | Status: AC
  Administered 2011-07-07: 1 via TOPICAL
  Filled 2011-07-07: qty 56

## 2011-07-07 MED ORDER — OXYCODONE-ACETAMINOPHEN 5-325 MG PO TABS: 2.0000 | ORAL_TABLET | ORAL | Status: DC | PRN

## 2011-07-07 MED ORDER — IBUPROFEN 600 MG PO TABS
600.0000 mg | ORAL_TABLET | Freq: Four times a day (QID) | ORAL | Status: DC | PRN
  Administered 2011-07-07: 600 mg via ORAL
  Filled 2011-07-07: qty 1

## 2011-07-07 MED ORDER — ACETAMINOPHEN 325 MG PO TABS: 650.0000 mg | ORAL_TABLET | ORAL | Status: DC | PRN

## 2011-07-07 MED ORDER — EPHEDRINE 5 MG/ML INJ: 10.0000 mg | INTRAVENOUS | Status: DC | PRN

## 2011-07-07 MED ORDER — OXYCODONE-ACETAMINOPHEN 5-325 MG PO TABS
1.0000 | ORAL_TABLET | ORAL | Status: DC | PRN
  Administered 2011-07-08: 1 via ORAL
  Filled 2011-07-07 (×2): qty 1

## 2011-07-07 MED ORDER — BENZOCAINE-MENTHOL 20-0.5 % EX AERO
1.0000 "application " | INHALATION_SPRAY | CUTANEOUS | Status: DC | PRN
  Administered 2011-07-07: 1 via TOPICAL

## 2011-07-07 MED ORDER — FLEET ENEMA 7-19 GM/118ML RE ENEM: 1.0000 | ENEMA | RECTAL | Status: DC | PRN

## 2011-07-07 MED ORDER — LACTATED RINGERS IV SOLN: INTRAVENOUS | Status: DC

## 2011-07-07 MED ORDER — ONDANSETRON HCL 4 MG/2ML IJ SOLN
4.0000 mg | Freq: Four times a day (QID) | INTRAMUSCULAR | Status: DC | PRN
  Administered 2011-07-07: 4 mg via INTRAVENOUS
  Filled 2011-07-07: qty 2

## 2011-07-07 MED ORDER — FENTANYL 2.5 MCG/ML BUPIVACAINE 1/10 % EPIDURAL INFUSION (WH - ANES)
INTRAMUSCULAR | Status: DC | PRN
  Administered 2011-07-07: 14 mL/h via EPIDURAL

## 2011-07-07 MED ORDER — DIPHENHYDRAMINE HCL 50 MG/ML IJ SOLN: 12.5000 mg | INTRAMUSCULAR | Status: DC | PRN

## 2011-07-07 MED ORDER — TETANUS-DIPHTH-ACELL PERTUSSIS 5-2.5-18.5 LF-MCG/0.5 IM SUSP: 0.5000 mL | Freq: Once | INTRAMUSCULAR | Status: DC

## 2011-07-07 MED ORDER — LANOLIN HYDROUS EX OINT: TOPICAL_OINTMENT | CUTANEOUS | Status: DC | PRN

## 2011-07-07 MED ORDER — LACTATED RINGERS IV SOLN
500.0000 mL | Freq: Once | INTRAVENOUS | Status: AC
  Administered 2011-07-07: 12:00:00 via INTRAVENOUS

## 2011-07-07 MED ORDER — FENTANYL 2.5 MCG/ML BUPIVACAINE 1/10 % EPIDURAL INFUSION (WH - ANES)
14.0000 mL/h | INTRAMUSCULAR | Status: DC
  Administered 2011-07-07: 14 mL/h via EPIDURAL
  Filled 2011-07-07 (×2): qty 60

## 2011-07-07 MED ORDER — PHENYLEPHRINE 40 MCG/ML (10ML) SYRINGE FOR IV PUSH (FOR BLOOD PRESSURE SUPPORT): 80.0000 ug | PREFILLED_SYRINGE | INTRAVENOUS | Status: DC | PRN

## 2011-07-07 MED ORDER — BUTORPHANOL TARTRATE 2 MG/ML IJ SOLN
2.0000 mg | INTRAMUSCULAR | Status: DC | PRN
  Administered 2011-07-07: 2 mg via INTRAVENOUS
  Filled 2011-07-07: qty 1

## 2011-07-07 MED ORDER — ONDANSETRON HCL 4 MG PO TABS: 4.0000 mg | ORAL_TABLET | ORAL | Status: DC | PRN

## 2011-07-07 MED ORDER — PRENATAL MULTIVITAMIN CH: 1.0000 | ORAL_TABLET | Freq: Every day | ORAL | Status: DC

## 2011-07-07 MED ORDER — ONDANSETRON HCL 4 MG/2ML IJ SOLN: 4.0000 mg | INTRAMUSCULAR | Status: DC | PRN

## 2011-07-07 MED ORDER — EPHEDRINE 5 MG/ML INJ
10.0000 mg | INTRAVENOUS | Status: DC | PRN
  Filled 2011-07-07: qty 4

## 2011-07-07 MED ORDER — LACTATED RINGERS IV SOLN: 500.0000 mL | INTRAVENOUS | Status: DC | PRN

## 2011-07-07 MED ORDER — PHENYLEPHRINE 40 MCG/ML (10ML) SYRINGE FOR IV PUSH (FOR BLOOD PRESSURE SUPPORT)
80.0000 ug | PREFILLED_SYRINGE | INTRAVENOUS | Status: DC | PRN
  Filled 2011-07-07: qty 5

## 2011-07-07 MED ORDER — ZOLPIDEM TARTRATE 5 MG PO TABS: 5.0000 mg | ORAL_TABLET | Freq: Every evening | ORAL | Status: DC | PRN

## 2011-07-07 NOTE — Progress Notes (Signed)
Pt moving and repositioning frequently--unable to trace baby properly--adjusting [place] frequently

## 2011-07-07 NOTE — Anesthesia Procedure Notes (Signed)

## 2011-07-07 NOTE — Progress Notes (Signed)
Pt laying on cuff during reading--will continue to assess-not believed to be accurate reading

## 2011-07-07 NOTE — Progress Notes (Signed)
[name] [name] is a 21 y.o. G1P0 at [redacted]w[redacted]d admitted for induction of labor due to Elective at term.  Subjective: Some pressure  Objective: BP 123/76  Pulse 85  Temp(Src) 97.5 F (36.4 C) (Oral)  Resp 18  Ht 5\' 3"  (1.6 m)  Wt 68.04 kg (150 lb)  BMI 26.57 kg/m2  SpO2 100%  LMP 10/04/2010      FHT:  FHR: 120-130 bpm, variability: moderate,  accelerations:  Present,  decelerations:  Absent UC:   regular, every 2-3 minutes SVE:   Dilation: 10 Effacement (%): 100 Station: +2 Exam by:: Dr Ellyn Hack  Labs: Lab Results  Component Value Date   WBC 8.0 07/07/2011   HGB 10.3* 07/07/2011   HCT 30.7* 07/07/2011   MCV 88.2 07/07/2011   PLT 246 07/07/2011    Assessment / Plan: Induction of labor due to term with favorable cervix,  progressing well on pitocin  Labor: Progressing normally, will start pushing Preeclampsia:  no signs or symptoms of toxicity Fetal Wellbeing:  Category I Pain Control:  Epidural I/D:  n/a Anticipated MOD:  NSVD  [name] [name],[name] [name] 07/07/2011, 5:10 PM

## 2011-07-07 NOTE — Anesthesia Preprocedure Evaluation (Signed)

## 2011-07-07 NOTE — H&P (Signed)
[name] [name] is a 21 y.o. female presenting for G1P0 at 40 weeks for iol.  +FM, no LOF, no VB, occ ctx; PNC complicated by UTI/pyelo and hospitalization.  Maternal Medical History:  Fetal activity: Perceived fetal activity is normal.    Prenatal complications: Infection.    pyelo/UTI OB History    Grav Para Term Preterm Abortions TAB SAB Ect Mult Living   1             G1 present no pap, no STDs  Past Medical History  Diagnosis Date  . Urinary tract infection     frequent  . Pyelonephritis complicating pregnancy   . MRSA (methicillin resistant Staphylococcus aureus) 2011  . Anxiety   . Mood disorder   . 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 Normal pregnancy, first 07/06/2011   Past Surgical History  Procedure Date  . Cystoscopy     ? not sure, had blocked ureter at age 43wks   Family History: family history includes Alcohol abuse in her father; Arthritis in her maternal grandmother; Cancer in her paternal aunt and paternal grandmother; Depression in her maternal grandmother and mother; and Mental illness in her father and paternal grandmother.  There is no history of Anesthesia problems. Social History:  reports that she quit smoking about 5 months ago. Her smoking use included Cigarettes. She has a 2.5 pack-year smoking history. She has never used smokeless tobacco. She reports that she drinks alcohol. She reports that she does not use illicit drugs. single, cosmetology school Meds PNV All NKDA  Review of Systems  Constitutional: Negative.   HENT: Negative.   Eyes: Negative.   Respiratory: Negative.   Cardiovascular: Negative.   Genitourinary: Negative.   Musculoskeletal: Negative.   Skin: Negative.   Neurological: Negative.   Psychiatric/Behavioral: Negative.     Dilation: 1.5 Effacement (%): 30 Station: -2 Exam by:: J.Thornton, RN Blood pressure 83/59, pulse 71, temperature 97.8 F (36.6 C), temperature source Oral, resp. rate 16,  height 5\' 3"  (1.6 m), weight 68.04 kg (150 lb), last menstrual period 10/04/2010. Maternal Exam:  Abdomen: Fundal height is appropriate for gestation.   Estimated fetal weight is 7#.   Fetal presentation: vertex     Physical Exam  Constitutional: She is oriented to person, place, and time. She appears well-developed and well-nourished.  HENT:  Head: Normocephalic and atraumatic.  Eyes: Conjunctivae are normal. Pupils are equal, round, and reactive to light.  Neck: Normal range of motion. Neck supple.  Cardiovascular: Normal rate and regular rhythm.   Respiratory: Effort normal and breath sounds normal.  GI: Soft. Bowel sounds are normal. She exhibits no distension. There is no tenderness.  Musculoskeletal: Normal range of motion.  Neurological: She is alert and oriented to person, place, and time.  Skin: Skin is warm and dry.  Psychiatric: She has a normal mood and affect. Her behavior is normal.    Prenatal labs: ABO, Rh: A/Positive/-- (07/20 0000) Antibody: Negative (07/20 0000) Rubella: Immune (07/20 0000) RPR: Nonreactive (07/20 0000)  HBsAg: Negative (07/20 0000)  HIV: Non-reactive (07/20 0000)  GBS: Negative (01/03 0000)  Hgb 12.7/ RI/ RPR NR/ Plt 227K/ GC neg/Chl neg/ CF neg/ glucola 91/   [place] cwd EDC 07/06/11, nl anat, post plac, female F/u completes normal anat  Assessment/Plan: 20yo G1P0 At 40+ for iol, secondary to term status/favorable cervix D/w pt R/B/A Epidural/ IV meds for pain gbbs neg, no prophylaxis Pitocin and AROM to induce Expect  SVD   BOVARD,Naoki Migliaccio 07/07/2011, 8:43 AM

## 2011-07-07 NOTE — Progress Notes (Signed)
[name] [name] is a 21 y.o. G1P0 at [redacted]w[redacted]d admitted for induction of labor due to Elective at term.  Subjective: Getting epidural, ctx much more uncomf  Objective: BP 108/54  Pulse 73  Temp(Src) 98 F (36.7 C) (Oral)  Resp 18  Ht 5\' 3"  (1.6 m)  Wt 68.04 kg (150 lb)  BMI 26.57 kg/m2  LMP 10/04/2010      FHT:  FHR: 120-130 bpm, variability: moderate,  accelerations:  Present,  decelerations:  Absent UC:   regular, every 2-4 minutes SVE:   Dilation: 3.5 Effacement (%): 80 Station: -2 Exam by:: J.Thornton, RN Pitocin at 9mU/min  Labs: Lab Results  Component Value Date   WBC 8.0 07/07/2011   HGB 10.3* 07/07/2011   HCT 30.7* 07/07/2011   MCV 88.2 07/07/2011   PLT 246 07/07/2011    Assessment / Plan: Induction of labor due to term with favorable cervix,  progressing well on pitocin  Labor: Progressing normally on pitocin Preeclampsia:  no signs or symptoms of toxicity Fetal Wellbeing:  Category I Pain Control:  Epidural I/D:  n/a Anticipated MOD:  NSVD  BOVARD,Bastian Andreoli 07/07/2011, 12:21 PM

## 2011-07-07 NOTE — Progress Notes (Signed)
[name] [name] is a 21 y.o. G1P0 at [redacted]w[redacted]d admitted for induction of labor due to Elective at term.  Subjective: No c/o's, +FM, occ ctx  Objective: BP 83/59  Pulse 71  Temp(Src) 97.8 F (36.6 C) (Oral)  Resp 16  Ht 5\' 3"  (1.6 m)  Wt 68.04 kg (150 lb)  BMI 26.57 kg/m2  LMP 10/04/2010      FHT:  FHR: 140-150 bpm, variability: moderate,  accelerations:  Present,  decelerations:  Absent UC:   regular, every 2-4 minutes SVE:   Dilation: 2 Effacement (%): 70 Station: -2 Exam by:: Carmelina Noun, MD AROM for moderate meconium, w/o diff/comp.  D/w pt and family  Labs: Lab Results  Component Value Date   WBC 8.0 07/07/2011   HGB 10.3* 07/07/2011   HCT 30.7* 07/07/2011   MCV 88.2 07/07/2011   PLT 246 07/07/2011    Assessment / Plan: Induction of labor due to term with favorable cervix,  progressing well on pitocin  Labor: Progressing on Pitocin, will continue to increase, AROM Preeclampsia:  no signs or symptoms of toxicity Fetal Wellbeing:  Category I Pain Control:  Epidural and Fentanyl prn I/D:  n/a Anticipated MOD:  NSVD  BOVARD,Ivyana Locey 07/07/2011, 8:59 AM

## 2011-07-08 ENCOUNTER — Encounter (HOSPITAL_COMMUNITY): Payer: Self-pay

## 2011-07-08 LAB — CBC
Hemoglobin: 8.6 g/dL — ABNORMAL LOW (ref 12.0–15.0)
MCH: 29.5 pg (ref 26.0–34.0)
MCV: 88.4 fL (ref 78.0–100.0)
RBC: 2.92 MIL/uL — ABNORMAL LOW (ref 3.87–5.11)

## 2011-07-08 NOTE — Anesthesia Postprocedure Evaluation (Signed)
  Anesthesia Post Note  Patient: [name] [name]  Procedure(s) Performed: * No procedures listed *  Anesthesia type: Epidural  Patient location: Mother/Baby  Post pain: Pain level controlled  Post assessment: Post-op Vital signs reviewed  Last Vitals:  Filed Vitals:   07/08/11 1344  BP: 104/68  Pulse: 71  Temp: 37.1 C  Resp: 18    Post vital signs: Reviewed  Level of consciousness:alert  Complications: No apparent anesthesia complications

## 2011-07-08 NOTE — Progress Notes (Signed)
Post Partum Day 1 Subjective: no complaints, up ad lib, voiding, tolerating PO and some cramping.  nl lochia, pain controlled  Objective: Blood pressure 115/72, pulse 80, temperature 98 F (36.7 C), temperature source Oral, resp. rate 18, height 5\' 3"  (1.6 m), weight 68.04 kg (150 lb), last menstrual period 10/04/2010, SpO2 100.00%, unknown if currently breastfeeding.  Physical Exam:  General: alert and no distress Lochia: appropriate Uterine Fundus: firm    Basename 07/08/11 0600 07/07/11 0730  HGB 8.6* 10.3*  HCT 25.8* 30.7*    Assessment/Plan: Plan for discharge tomorrow and Breastfeeding   LOS: 1 day   BOVARD,Ygnacio Fecteau 07/08/2011, 9:30 AM

## 2011-07-08 NOTE — Progress Notes (Signed)
UR chart review completed.  

## 2011-07-09 MED ORDER — PRENATAL MULTIVITAMIN CH: 1.0000 | ORAL_TABLET | Freq: Every day | ORAL | Status: DC

## 2011-07-09 MED ORDER — IBUPROFEN 800 MG PO TABS: 800.0000 mg | ORAL_TABLET | Freq: Three times a day (TID) | ORAL | Status: DC | PRN

## 2011-07-09 MED ORDER — OXYCODONE-ACETAMINOPHEN 5-325 MG PO TABS: 1.0000 | ORAL_TABLET | ORAL | Status: AC | PRN

## 2011-07-09 NOTE — Discharge Summary (Signed)
Obstetric Discharge Summary Reason for Admission: induction of labor Prenatal Procedures: none Intrapartum Procedures: spontaneous vaginal delivery Postpartum Procedures: none Complications-Operative and Postpartum: B labial  laceration Hemoglobin  Date Value Range Status  07/08/2011 8.6* 12.0-15.0 (g/dL) Final     HCT  Date Value Range Status  07/08/2011 25.8* 36.0-46.0 (%) Final    Discharge Diagnoses: Term Pregnancy-delivered  Discharge Information: Date: 07/09/2011 Activity: pelvic rest Diet: routine Medications: PNV, Ibuprofen and Percocet Condition: stable Instructions: refer to practice specific booklet Discharge to: home Follow-up Information    Follow up with BOVARD,Cynia Abruzzo, MD. Schedule an appointment as soon as possible for a visit in 6 weeks.   Contact information:   [address] [facility] [address] [place] [address] [phone]          Newborn Data: Live born female  Birth Weight: 8 lb 8 oz (3855 g) APGAR: 8, 9  Home with mother.  BOVARD,Jennalee Greaves 07/09/2011, 8:46 AM  Breastfeeding/Implanon
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
## 2011-07-09 NOTE — Progress Notes (Signed)
Post Partum Day 2 Subjective: no complaints, up ad lib, voiding, tolerating PO and + flatus  Nl lochia, pain controlled  Objective: Blood pressure 112/75, pulse 77, temperature 98 F (36.7 C), temperature source Oral, resp. rate 18, height 5\' 3"  (1.6 m), weight 68.04 kg (150 lb), last menstrual period 10/04/2010, SpO2 100.00%, unknown if currently breastfeeding.  Physical Exam:  General: alert and no distress Lochia: appropriate Uterine Fundus: firm   Basename 07/08/11 0600 07/07/11 0730  HGB 8.6* 10.3*  HCT 25.8* 30.7*    Assessment/Plan: Discharge home, Breastfeeding and Contraception Implanon at office.  D/C with Motrin/Percocet/ PNV.  F/u 6 wks   LOS: 2 days   BOVARD,Avrohom Mckelvin 07/09/2011, 8:38 AM

## 2013-10-01 ENCOUNTER — Ambulatory Visit (INDEPENDENT_AMBULATORY_CARE_PROVIDER_SITE_OTHER): Payer: Medicaid Other | Admitting: Nurse Practitioner

## 2013-10-01 ENCOUNTER — Encounter: Payer: Self-pay | Admitting: Nurse Practitioner

## 2013-10-01 VITALS — BP 120/83 | HR 110 | Temp 97.4°F | Ht 63.0 in | Wt 111.9 lb

## 2013-10-01 DIAGNOSIS — IMO0002 Reserved for concepts with insufficient information to code with codable children: Secondary | ICD-10-CM

## 2013-10-01 DIAGNOSIS — F1910 Other psychoactive substance abuse, uncomplicated: Secondary | ICD-10-CM

## 2013-10-01 DIAGNOSIS — T1491XA Suicide attempt, initial encounter: Secondary | ICD-10-CM

## 2013-10-01 DIAGNOSIS — F319 Bipolar disorder, unspecified: Secondary | ICD-10-CM

## 2013-10-01 MED ORDER — NORGESTIMATE-ETH ESTRADIOL 0.25-35 MG-MCG PO TABS: 1.0000 | ORAL_TABLET | Freq: Every day | ORAL | Status: DC

## 2013-10-01 MED ORDER — IBUPROFEN 800 MG PO TABS: 800.0000 mg | ORAL_TABLET | Freq: Three times a day (TID) | ORAL | Status: AC | PRN

## 2013-10-01 NOTE — Patient Instructions (Signed)
Pelvic Pain, Female °Female pelvic pain can be caused by many different things and start from a variety of places. Pelvic pain refers to pain that is located in the lower half of the abdomen and between your hips. The pain may occur over a short period of time (acute) or may be reoccurring (chronic). The cause of pelvic pain may be related to disorders affecting the female reproductive organs (gynecologic), but it may also be related to the bladder, kidney stones, an intestinal complication, or muscle or skeletal problems. Getting help right away for pelvic pain is important, especially if there has been severe, sharp, or a sudden onset of unusual pain. It is also important to get help right away because some types of pelvic pain can be life threatening.  °CAUSES  °Below are only some of the causes of pelvic pain. The causes of pelvic pain can be in one of several categories.  °· Gynecologic. °· Pelvic inflammatory disease. °· Sexually transmitted infection. °· Ovarian cyst or a twisted ovarian ligament (ovarian torsion). °· Uterine lining that grows outside the uterus (endometriosis). °· Fibroids, cysts, or tumors. °· Ovulation. °· Pregnancy. °· Pregnancy that occurs outside the uterus (ectopic pregnancy). °· Miscarriage. °· Labor. °· Abruption of the placenta or ruptured uterus. °· Infection. °· Uterine infection (endometritis). °· Bladder infection. °· Diverticulitis. °· Miscarriage related to a uterine infection (septic abortion). °· Bladder. °· Inflammation of the bladder (cystitis). °· Kidney stone(s). °· Gastrointenstinal. °· Constipation. °· Diverticulitis. °· Neurologic. °· Trauma. °· Feeling pelvic pain because of mental or emotional causes (psychosomatic). °· Cancers of the bowel or pelvis. °EVALUATION  °Your caregiver will want to take a careful history of your concerns. This includes recent changes in your health, a careful gynecologic history of your periods (menses), and a sexual history. Obtaining  your family history and medical history is also important. Your caregiver may suggest a pelvic exam. A pelvic exam will help identify the location and severity of the pain. It also helps in the evaluation of which organ system may be involved. In order to identify the cause of the pelvic pain and be properly treated, your caregiver may order tests. These tests may include:  °· A pregnancy test. °· Pelvic ultrasonography. °· An X-ray exam of the abdomen. °· A urinalysis or evaluation of vaginal discharge. °· Blood tests. °HOME CARE INSTRUCTIONS  °· Only take over-the-counter or prescription medicines for pain, discomfort, or fever as directed by your caregiver.   °· Rest as directed by your caregiver.   °· Eat a balanced diet.   °· Drink enough fluids to make your urine clear or pale yellow, or as directed.   °· Avoid sexual intercourse if it causes pain.   °· Apply warm or cold compresses to the lower abdomen depending on which one helps the pain.   °· Avoid stressful situations.   °· Keep a journal of your pelvic pain. Write down when it started, where the pain is located, and if there are things that seem to be associated with the pain, such as food or your menstrual cycle. °· Follow up with your caregiver as directed.   °SEEK MEDICAL CARE IF: °· Your medicine does not help your pain. °· You have abnormal vaginal discharge. °SEEK IMMEDIATE MEDICAL CARE IF:  °· You have heavy bleeding from the vagina.   °· Your pelvic pain increases.   °· You feel lightheaded or faint.   °· You have chills.   °· You have pain with urination or blood in your urine.   °· You have uncontrolled   diarrhea or vomiting.   °· You have a fever or persistent symptoms for more than 3 days. °· You have a fever and your symptoms suddenly get worse.   °· You are being physically or sexually abused.   °MAKE SURE YOU: °· Understand these instructions. °· Will watch your condition. °· Will get help if you are not doing well or get worse. °Document  Released: 04/27/2004 Document Revised: 11/30/2011 Document Reviewed: 09/20/2011 °[employer]® Patient Information ©2014 [employer]. ° °

## 2013-10-01 NOTE — Progress Notes (Signed)
History:  [name] [name] is a 23 y.o. G3P1001 who [facility] for painful intercourse, left sided greater than right side. This has been ongoing for about 2 years since the birth of her child.  She needs annual exam and pap smear as it has not ben done in 2 years. She declines any STD screening today. She has not taken any medications for this problem. She did get Nexpalnon after the birth of last child and feels the problems have been ongoing since then. She has a history of bipolar and multiple drug abuse issues including marijuana and cocaine. She has had 2 overdoses in past.   The following portions of the patient's history were reviewed and updated as appropriate: allergies, current medications, past family history, past medical history, past social history, past surgical history and problem list.  Review of Systems:  Pertinent items are noted in HPI.  Objective:  Physical Exam BP 120/83  Pulse 110  Temp(Src) 97.4 F (36.3 C)  Ht 5\' 3"  (1.6 m)  Wt 50.758 kg (111 lb 14.4 oz)  BMI 19.83 kg/m2  LMP 09/10/2013  Breastfeeding? No GENERAL: Well-developed, well-nourished female in no acute distress.  HEENT: Normocephalic, atraumatic.  ABDOMEN: Soft, nontender, nondistended. No organomegaly. Normal bowel sounds appreciated in all quadrants. Old scarring from bladder surgery as child PELVIC: Normal external female genitalia. Vagina is pink and rugated.  Normal discharge. Normal cervix contour. Uterus is normal in size. No adnexal mass or tenderness.  EXTREMITIES: No cyanosis, clubbing, or edema, 2+ distal pulses.   Labs and Imaging No results found.  Assessment & Plan:  Assessment:  Painful Intercourse Hx substance abuse  Plans:  Advised pt we could add back some estrogen in form of BCPs. Orthocyclen daily Motrin 800 mg TID PRN Pelvic U/S Schedule for annual exam  Delbert PhenixLinda M Kinston Magnan, NP 10/01/2013 5:35 PM

## 2013-10-08 ENCOUNTER — Ambulatory Visit (HOSPITAL_COMMUNITY)
Admission: RE | Admit: 2013-10-08 | Discharge: 2013-10-08 | Disposition: A | Discharge status: Home / Self Care | Payer: Self-pay | Source: Ambulatory Visit | Attending: Nurse Practitioner | Admitting: Nurse Practitioner

## 2013-10-08 DIAGNOSIS — IMO0002 Reserved for concepts with insufficient information to code with codable children: Secondary | ICD-10-CM | POA: Insufficient documentation

## 2013-10-08 DIAGNOSIS — N854 Malposition of uterus: Secondary | ICD-10-CM | POA: Insufficient documentation

## 2013-10-12 ENCOUNTER — Telehealth: Payer: Self-pay | Admitting: *Deleted

## 2013-10-12 ENCOUNTER — Encounter: Payer: Self-pay | Admitting: *Deleted

## 2013-10-12 NOTE — Telephone Encounter (Signed)
Message copied by Dorothyann PengHAIZLIP, Nick Stults E on Fri Oct 12, 2013  8:29 AM ------      Message from: BAREFOOT, [place]      Created: Fri Oct 12, 2013  8:10 AM       Can you call and let her know her pelvic ultrasound was negative, thank you, Bonita QuinLinda B ------

## 2013-10-12 NOTE — Telephone Encounter (Signed)
Called patient to inform of ultrasound results, no answer, unable to leave a message due to mailbox has not been set up.    Letter sent

## 2013-12-07 ENCOUNTER — Ambulatory Visit: Payer: Self-pay | Admitting: Nurse Practitioner

## 2014-04-15 ENCOUNTER — Encounter: Payer: Self-pay | Admitting: Nurse Practitioner

## 2015-02-04 ENCOUNTER — Inpatient Hospital Stay (HOSPITAL_COMMUNITY)
Admission: AD | Admit: 2015-02-04 | Discharge: 2015-02-04 | Disposition: A | Discharge status: Home / Self Care | Payer: Self-pay | Source: Ambulatory Visit | Attending: Obstetrics & Gynecology | Admitting: Obstetrics & Gynecology

## 2015-02-04 ENCOUNTER — Encounter (HOSPITAL_COMMUNITY): Payer: Self-pay | Admitting: *Deleted

## 2015-02-04 DIAGNOSIS — N3000 Acute cystitis without hematuria: Secondary | ICD-10-CM

## 2015-02-04 DIAGNOSIS — N390 Urinary tract infection, site not specified: Secondary | ICD-10-CM | POA: Insufficient documentation

## 2015-02-04 LAB — CBC WITH DIFFERENTIAL/PLATELET
BASOS PCT: 1 % (ref 0–1)
Basophils Absolute: 0 10*3/uL (ref 0.0–0.1)
Eosinophils Absolute: 0.2 10*3/uL (ref 0.0–0.7)
Eosinophils Relative: 2 % (ref 0–5)
HEMATOCRIT: 38.1 % (ref 36.0–46.0)
HEMOGLOBIN: 13.2 g/dL (ref 12.0–15.0)
LYMPHS ABS: 2 10*3/uL (ref 0.7–4.0)
Lymphocytes Relative: 25 % (ref 12–46)
MCH: 32.8 pg (ref 26.0–34.0)
MCHC: 34.6 g/dL (ref 30.0–36.0)
MCV: 94.8 fL (ref 78.0–100.0)
MONOS PCT: 8 % (ref 3–12)
Monocytes Absolute: 0.6 10*3/uL (ref 0.1–1.0)
NEUTROS ABS: 5.1 10*3/uL (ref 1.7–7.7)
NEUTROS PCT: 64 % (ref 43–77)
Platelets: 234 10*3/uL (ref 150–400)
RBC: 4.02 MIL/uL (ref 3.87–5.11)
RDW: 11.6 % (ref 11.5–15.5)
WBC: 7.9 10*3/uL (ref 4.0–10.5)

## 2015-02-04 LAB — URINALYSIS, ROUTINE W REFLEX MICROSCOPIC
Bilirubin Urine: NEGATIVE
Glucose, UA: NEGATIVE mg/dL
Ketones, ur: NEGATIVE mg/dL
Nitrite: NEGATIVE
PROTEIN: NEGATIVE mg/dL
Specific Gravity, Urine: 1.02 (ref 1.005–1.030)
UROBILINOGEN UA: 0.2 mg/dL (ref 0.0–1.0)
pH: 7.5 (ref 5.0–8.0)

## 2015-02-04 LAB — WET PREP, GENITAL
TRICH WET PREP: NONE SEEN
Yeast Wet Prep HPF POC: NONE SEEN

## 2015-02-04 LAB — URINE MICROSCOPIC-ADD ON

## 2015-02-04 LAB — POCT PREGNANCY, URINE: PREG TEST UR: NEGATIVE

## 2015-02-04 MED ORDER — AZITHROMYCIN 250 MG PO TABS
1000.0000 mg | ORAL_TABLET | Freq: Once | ORAL | Status: AC
  Administered 2015-02-04: 1000 mg via ORAL
  Filled 2015-02-04: qty 4

## 2015-02-04 MED ORDER — CIPROFLOXACIN HCL 250 MG PO TABS: 250.0000 mg | ORAL_TABLET | Freq: Two times a day (BID) | ORAL | Status: DC

## 2015-02-04 MED ORDER — OXYCODONE-ACETAMINOPHEN 5-325 MG PO TABS: 1.0000 | ORAL_TABLET | Freq: Four times a day (QID) | ORAL | Status: DC | PRN

## 2015-02-04 MED ORDER — HYDROMORPHONE HCL 1 MG/ML IJ SOLN
1.0000 mg | Freq: Once | INTRAMUSCULAR | Status: AC
  Administered 2015-02-04: 1 mg via INTRAMUSCULAR
  Filled 2015-02-04: qty 1

## 2015-02-04 NOTE — Discharge Instructions (Signed)

## 2015-02-04 NOTE — MAU Provider Note (Signed)
History     CSN: [id number]  Arrival date and time: 02/04/15 7829   First Provider Initiated Contact with Patient 02/04/15 2123      Chief Complaint  Patient presents with  . Vaginal Pain   HPI  Ms. KAYTEE TALIERCIO is a 24 y.o. G3P1001 who presents to MAU today with complaint of vaginal and inguinal pain since earlier today. She states that pain is rated at 9/10 now. She has not taken anything for pain. She feels swollen in the right inguinal regional. She denies vaginal bleeding, discharge, N/V/D, UTI symptoms or fever. She is sexually active with one partner.   OB History    Gravida Para Term Preterm AB TAB SAB Ectopic Multiple Living   Past Medical History  Diagnosis Date  . Urinary tract infection     frequent  . Pyelonephritis complicating pregnancy   . MRSA (methicillin resistant Staphylococcus aureus) 2011  . Anxiety   . Mood disorder   . 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 Normal pregnancy, first 07/06/2011  . SVD (spontaneous vaginal delivery) 07/07/2011    Past Surgical History  Procedure Laterality Date  . Cystoscopy      ? not sure, had blocked ureter at age 20wks    Family History  Problem Relation Age of Onset  . Anesthesia problems Neg Hx   . Depression Mother   . Alcohol abuse Father   . Mental illness Father     bipolar  . Cancer Paternal Aunt     cervix  . Depression Maternal Grandmother     depression  . Arthritis Maternal Grandmother   . Mental illness Paternal Grandmother     bipolar  . Cancer Paternal Grandmother     cervix    Social History  Substance Use Topics  . Smoking status: Current Every Day Smoker -- 0.50 packs/day for 5 years    Types: Cigarettes    Last Attempt to Quit: 01/11/2011  . Smokeless tobacco: Never Used  . Alcohol Use: 16.8 oz/week    14 Standard drinks or equivalent, 14 Glasses of wine per week     Comment: prior to preg    Allergies:  Allergies  Allergen  Reactions  . Amoxicillin-Pot Clavulanate Anaphylaxis and Hives  . Bactrim Hives  . Penicillins Hives    No prescriptions prior to admission    Review of Systems  Constitutional: Negative for fever and malaise/fatigue.  Gastrointestinal: Negative for nausea, vomiting, abdominal pain, diarrhea and constipation.  Genitourinary: Negative for dysuria, urgency and frequency.       Neg - vaginal bleeding, discharge   Physical Exam   Blood pressure 116/81, pulse 71, temperature 98 F (36.7 C), temperature source Oral, resp. rate 16, height 5' 3.25" (1.607 m), weight 113 lb 3 oz (51.342 kg), last menstrual period 01/23/2015, SpO2 100 %.  Physical Exam  Nursing note and vitals reviewed. Constitutional: She is oriented to person, place, and time. She appears well-developed and well-nourished. No distress.  HENT:  Head: Normocephalic and atraumatic.  Cardiovascular: Normal rate.   Respiratory: Effort normal.  GI: Soft. She exhibits no distension and no mass. There is no tenderness. There is CVA tenderness (right sided ). There is no rebound and no guarding.  Genitourinary: Uterus is not enlarged and not tender. Cervix exhibits discharge. Cervix exhibits no motion tenderness and no friability. Right adnexum displays  no mass and no tenderness. Left adnexum displays no mass and no tenderness. No bleeding in the vagina. Vaginal discharge (small amount of thick yellow discharge noted) found.  Lymphadenopathy:       Right: Inguinal adenopathy present.       Left: No inguinal adenopathy present.  Neurological: She is alert and oriented to person, place, and time.  Skin: Skin is warm and dry. No erythema.  Psychiatric: She has a normal mood and affect.    Results for orders placed or performed during the hospital encounter of 02/04/15 (from the past 24 hour(s))  Urinalysis, Routine w reflex microscopic (not at [facility])     Status: Abnormal   Collection Time: 02/04/15  6:50 PM  Result Value Ref Range    Color, Urine YELLOW YELLOW   APPearance HAZY (A) CLEAR   Specific Gravity, Urine 1.020 1.005 - 1.030   pH 7.5 5.0 - 8.0   Glucose, UA NEGATIVE NEGATIVE mg/dL   Hgb urine dipstick SMALL (A) NEGATIVE   Bilirubin Urine NEGATIVE NEGATIVE   Ketones, ur NEGATIVE NEGATIVE mg/dL   Protein, ur NEGATIVE NEGATIVE mg/dL   Urobilinogen, UA 0.2 0.0 - 1.0 mg/dL   Nitrite NEGATIVE NEGATIVE   Leukocytes, UA LARGE (A) NEGATIVE  Urine microscopic-add on     Status: Abnormal   Collection Time: 02/04/15  6:50 PM  Result Value Ref Range   Squamous Epithelial / LPF FEW (A) RARE   WBC, UA 21-50 <3 WBC/hpf   RBC / HPF 0-2 <3 RBC/hpf   Bacteria, UA MANY (A) RARE  Pregnancy, urine POC     Status: None   Collection Time: 02/04/15  6:55 PM  Result Value Ref Range   Preg Test, Ur NEGATIVE NEGATIVE  CBC with Differential/Platelet     Status: None   Collection Time: 02/04/15  9:35 PM  Result Value Ref Range   WBC 7.9 4.0 - 10.5 K/uL   RBC 4.02 3.87 - 5.11 MIL/uL   Hemoglobin 13.2 12.0 - 15.0 g/dL   HCT 40.9 81.1 - 91.4 %   MCV 94.8 78.0 - 100.0 fL   MCH 32.8 26.0 - 34.0 pg   MCHC 34.6 30.0 - 36.0 g/dL   RDW 78.2 95.6 - 21.3 %   Platelets 234 150 - 400 K/uL   Neutrophils Relative % 64 43 - 77 %   Neutro Abs 5.1 1.7 - 7.7 K/uL   Lymphocytes Relative 25 12 - 46 %   Lymphs Abs 2.0 0.7 - 4.0 K/uL   Monocytes Relative 8 3 - 12 %   Monocytes Absolute 0.6 0.1 - 1.0 K/uL   Eosinophils Relative 2 0 - 5 %   Eosinophils Absolute 0.2 0.0 - 0.7 K/uL   Basophils Relative 1 0 - 1 %   Basophils Absolute 0.0 0.0 - 0.1 K/uL  Wet prep, genital     Status: Abnormal   Collection Time: 02/04/15 10:15 PM  Result Value Ref Range   Yeast Wet Prep HPF POC NONE SEEN NONE SEEN   Trich, Wet Prep NONE SEEN NONE SEEN   Clue Cells Wet Prep HPF POC FEW (A) NONE SEEN   WBC, Wet Prep HPF POC MODERATE (A) NONE SEEN    MAU Course  Procedures None  MDM UPT - negative UA and CBC today Treated presumptively for chlamydia  given exam - 1000 mg Zithromax given. Will wait for cultures to treat for Gonorrhea as patient is PCN allergic.  Urine culture pending  Assessment and Plan  A: UTI, possible early pyelonephritis Concern for STD  P: Discharge home Rx for Cipro and Percocet given to patient Warning signs for worsening condition discussed GC/Chlamydia and urine culture pending Patient advised to follow-up with PCP of choice for routine health maintenance Patient may return to MAU as needed or if her condition were to change or worsen  Marny Lowenstein, PA-C  02/05/2015, 1:33 AM

## 2015-02-04 NOTE — MAU Note (Signed)
Vaginal & R groin pain started this morning, also R flank pain.  Denies bleeding or discharge.  Denies itching or irritation.

## 2015-02-04 NOTE — MAU Note (Signed)
E signature page not working.  Patient signed paper copy.

## 2015-02-05 LAB — URINE CULTURE: Culture: NO GROWTH

## 2015-02-05 LAB — HIV ANTIBODY (ROUTINE TESTING W REFLEX): HIV SCREEN 4TH GENERATION: NONREACTIVE

## 2015-02-05 LAB — RPR: RPR: NONREACTIVE

## 2015-02-06 LAB — GC/CHLAMYDIA PROBE AMP (~~LOC~~) NOT AT ARMC
Chlamydia: NEGATIVE
Neisseria Gonorrhea: POSITIVE — AB

## 2015-02-10 ENCOUNTER — Encounter (HOSPITAL_COMMUNITY): Payer: Self-pay | Admitting: *Deleted

## 2015-02-10 ENCOUNTER — Encounter: Payer: Self-pay | Admitting: *Deleted

## 2015-09-13 ENCOUNTER — Encounter (HOSPITAL_COMMUNITY): Payer: Self-pay | Admitting: Emergency Medicine

## 2015-09-13 ENCOUNTER — Emergency Department (HOSPITAL_COMMUNITY)
Admission: EM | Admit: 2015-09-13 | Discharge: 2015-09-13 | Disposition: A | Discharge status: Home / Self Care | Payer: Self-pay | Attending: Emergency Medicine | Admitting: Emergency Medicine

## 2015-09-13 DIAGNOSIS — H6091 Unspecified otitis externa, right ear: Secondary | ICD-10-CM | POA: Insufficient documentation

## 2015-09-13 DIAGNOSIS — Z792 Long term (current) use of antibiotics: Secondary | ICD-10-CM | POA: Insufficient documentation

## 2015-09-13 DIAGNOSIS — Z3202 Encounter for pregnancy test, result negative: Secondary | ICD-10-CM | POA: Insufficient documentation

## 2015-09-13 DIAGNOSIS — Z87440 Personal history of urinary (tract) infections: Secondary | ICD-10-CM | POA: Insufficient documentation

## 2015-09-13 DIAGNOSIS — H7291 Unspecified perforation of tympanic membrane, right ear: Secondary | ICD-10-CM | POA: Insufficient documentation

## 2015-09-13 DIAGNOSIS — Z880 Allergy status to penicillin: Secondary | ICD-10-CM | POA: Insufficient documentation

## 2015-09-13 DIAGNOSIS — F17210 Nicotine dependence, cigarettes, uncomplicated: Secondary | ICD-10-CM | POA: Insufficient documentation

## 2015-09-13 DIAGNOSIS — Z8614 Personal history of Methicillin resistant Staphylococcus aureus infection: Secondary | ICD-10-CM | POA: Insufficient documentation

## 2015-09-13 DIAGNOSIS — Z8659 Personal history of other mental and behavioral disorders: Secondary | ICD-10-CM | POA: Insufficient documentation

## 2015-09-13 LAB — POC URINE PREG, ED: Preg Test, Ur: NEGATIVE

## 2015-09-13 MED ORDER — AZITHROMYCIN 250 MG PO TABS: 250.0000 mg | ORAL_TABLET | Freq: Every day | ORAL | Status: DC

## 2015-09-13 MED ORDER — OFLOXACIN 0.3 % OT SOLN: 5.0000 [drp] | Freq: Two times a day (BID) | OTIC | Status: DC

## 2015-09-13 NOTE — Discharge Instructions (Signed)
Please use the ear drops and take the antibiotic pills as prescribed. You may take ibuprofen as needed for pain. Please call Dr. Ellyn HackGore's office (the ear, nose, and throat specialist) Monday morning to schedule a follow up appointment. Return to the ER for new or worsening symptoms.   Eardrum Perforation An eardrum perforation is a puncture or tear in the eardrum. This is also called a ruptured eardrum. The eardrum is a thin, round tissue inside of your ear that separates your ear canal from your middle ear. This is the tissue that detects sound and enables you to hear. An eardrum perforation can cause discomfort and hearing loss. In most cases, the eardrum will heal without treatment and with little or no permanent hearing loss. CAUSES An eardrum perforation can result from different causes, including:  Sudden pressure changes that happen in situations such as scuba diving or flying in an airplane.  Foreign objects in the ear.  Inserting a cotton-tipped swab or any blunt object into the ear.  Loud noise.  Trauma to the ear.  Attempting to remove an object from the ear. SIGNS AND SYMPTOMS  Hearing loss.  Ear pain.  Ringing in the ear.  Discharge or bleeding from the ear.  Dizziness.  Vomiting.  Facial paralysis. DIAGNOSIS  Your health care provider will examine your ear using a tool called an otoscope. This tool allows the health care provider to see into your ear to examine your eardrum. Various types of hearing tests may also be done. TREATMENT  Typically, the eardrum will heal on its own within a few weeks. If your eardrum does not heal, your health care provider may recommend one of the following treatments:  A procedure to place a patch over your eardrum.  Surgery to repair your eardrum. HOME CARE INSTRUCTIONS   Keep your ear dry. This will improve healing. Do not submerge your head under water until healing is complete. Do not swim or dive until your health care  provider approves. While bathing or showering, protect your ear using one of these methods:  Using a waterproof earplug.  Covering a piece of cotton with petroleum jelly and placing it in your outer ear canal.  Take medicines only as directed by your health care provider.  Avoid blowing your nose if possible. If you blow your nose, do it gently. Forceful blowing increases the pressure in your middle ear. This may cause further injury or may delay your healing.  Resume your normal activities after the perforation has healed. Your health care provider can tell you when this has occurred.  Talk to your health care provider before you fly on an airplane. Air travel is generally allowed with a perforated eardrum.  Keep all follow-up visits as directed by your health care provider. This is important. SEEK MEDICAL CARE IF:  You have a fever. SEEK IMMEDIATE MEDICAL CARE IF:  You have blood or pus coming from your ear.  You have dizziness or problems with balance.  You have nausea or vomiting.  You have increased pain.   This information is not intended to replace advice given to you by your health care provider. Make sure you discuss any questions you have with your health care provider.   Document Released: 05/28/2000 Document Revised: 06/21/2014 Document Reviewed: 01/07/2014 Elsevier Interactive Patient Education [employer]2016 [employer] Inc[employer]

## 2015-09-13 NOTE — ED Provider Notes (Signed)
CSN: [id number]     Arrival date & time 09/13/15  1343 History  By signing my name below, I, Linna DarnerRussell Turner, attest that this documentation has been prepared under the direction and in the presence of non-physician practitioner, Noelle PennerSerena Daejon Lich, PA-C. Electronically Signed: Linna Darnerussell Turner, Scribe. 09/13/2015. 1:51 PM.   Chief Complaint  Patient presents with  . Otalgia    The history is provided by the patient. No language interpreter was used.     HPI Comments: [name] [name] is a 25 y.o. female with who presents to the Emergency Department complaining of constant, right otalgia for the last two weeks. She endorses associated right-sided facial swelling as well that has since resolved. Pt had a right-sided dermal piercing on her face two weeks ago and was not sure if this was the cause of her pain. She notes that hearing in her right ear is very muffled. She thinks the muffled hearing started before the pain. Pt has been cleaning her right ear with peroxide and has experienced yellow drainage and occasional blood from her right ear visible on the cotton swab after cleaning. She endorses mild pain exacerbation with palpation to her right ear. Pt states that her right ear pain is currently 3/10 and notes that she cleaned her right ear PTA; she states that at its most severe her right ear pain is 10/10. Pt has taken Tylenol for her symptoms with no relief. She notes that she has not been using q-tips since onset. She denies left ear problems, difficulty swallowing, fever, or any other associated symptoms.  Pt also requests a pregnancy test. Her LNMP was in February and she notes that her periods are usually fairly regular. She is sexually active and does not use protection. Denies abdominal pain. Denies vaginal discharge or bleeding. She declines STI screening today.  Past Medical History  Diagnosis Date  . Urinary tract infection     frequent  . Pyelonephritis complicating pregnancy   . MRSA  (methicillin resistant Staphylococcus aureus) 2011  . Anxiety   . Mood disorder (HCC)   . 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 Normal pregnancy, first 07/06/2011  . SVD (spontaneous vaginal delivery) 07/07/2011   Past Surgical History  Procedure Laterality Date  . Cystoscopy      ? not sure, had blocked ureter at age 89wks   Family History  Problem Relation Age of Onset  . Anesthesia problems Neg Hx   . Depression Mother   . Alcohol abuse Father   . Mental illness Father     bipolar  . Cancer Paternal Aunt     cervix  . Depression Maternal Grandmother     depression  . Arthritis Maternal Grandmother   . Mental illness Paternal Grandmother     bipolar  . Cancer Paternal Grandmother     cervix   Social History  Substance Use Topics  . Smoking status: Current Every Day Smoker -- 0.50 packs/day for 5 years    Types: Cigarettes    Last Attempt to Quit: 01/11/2011  . Smokeless tobacco: Never Used  . Alcohol Use: 16.8 oz/week    14 Standard drinks or equivalent, 14 Glasses of wine per week     Comment: prior to preg   OB History    Gravida Para Term Preterm AB TAB SAB Ectopic Multiple Living   3 1 1       1      Review of Systems  Constitutional:  Negative for fever.  HENT: Positive for ear discharge and ear pain (right). Negative for trouble swallowing.       Allergies  Amoxicillin-pot clavulanate; Bactrim; and Penicillins  Home Medications   Prior to Admission medications   Medication Sig Start Date End Date Taking? Authorizing Provider  acetaminophen (TYLENOL) 325 MG tablet Take 975 mg by mouth every 6 (six) hours as needed for mild pain.    Historical Provider, MD  Aspirin-Salicylamide-Caffeine (BC HEADACHE POWDER PO) Take 1 packet by mouth daily as needed (headache).    Historical Provider, MD  ciprofloxacin (CIPRO) 250 MG tablet Take 1 tablet (250 mg total) by mouth every 12 (twelve) hours. 02/04/15   Marny Lowenstein, PA-C   oxyCODONE-acetaminophen (PERCOCET/ROXICET) 5-325 MG per tablet Take 1 tablet by mouth every 6 (six) hours as needed for severe pain. 02/04/15   Marny Lowenstein, PA-C   BP 112/71 mmHg  Pulse 79  Temp(Src) 98 F (36.7 C) (Oral)  Resp 18  SpO2 100%  LMP  Physical Exam  Constitutional: She is oriented to person, place, and time. She appears well-developed and well-nourished. No distress.  HENT:  Head: Normocephalic and atraumatic.  Left ear is normal  Right ear with mild tragal tenderness. No tenderness with manipulation of pinna. Canal has copious thick white-yellow discharge though the canal itself does not appear edematous. The visible portion of the TM appears to be ruptured with some discharge coming from the middle ear.   No trismus. No posterior oropharyngeal edema or erythema.  Eyes: Conjunctivae and EOM are normal.  Neck: Neck supple. No tracheal deviation present.  Cardiovascular: Normal rate.   Pulmonary/Chest: Effort normal. No respiratory distress.  Musculoskeletal: Normal range of motion.  Neurological: She is alert and oriented to person, place, and time.  Skin: Skin is warm and dry.  Psychiatric: She has a normal mood and affect. Her behavior is normal.  Nursing note and vitals reviewed.   ED Course  Procedures (including critical care time)  DIAGNOSTIC STUDIES: Oxygen Saturation is 100% on RA, normal by my interpretation.    COORDINATION OF CARE: 1:51 PM Will order pregnancy test. Discussed treatment plan with pt at bedside and pt agreed to plan.  Labs Review Labs Reviewed - No data to display  Imaging Review No results found. I have personally reviewed and evaluated these images and lab results as part of my medical decision-making.   EKG Interpretation None      MDM   Final diagnoses:  Ruptured tympanic membrane, right  Otitis externa, right  Encounter for pregnancy test with result negative    On my exam pt appears to have a ruptured tympanic  membrane in her right ear with concomitant purulent discharge. No edema of the canal itself. Appears to be bacterial rather than fungal. Based on story of muffled hearing prior to onset of pain and discharge suspect pt might have had suppurative otitis media with resultant rupture of TM. She does endorse using q-tips to clean her ears but denies recent injury/trauma or acute onset of pain with q-tip use to suggest traumatic TM rupture. Rx given for ofloxacin drops. Pt reports she is anaphylactic to amoxicillin/penicillins so rx given for PO azithromycin. Instructed close f/u with ENT on Monday. She may take ibuprofen as needed for pain.   Pt also requesting pregnancy test. Result is negative. She is otherwise asymptomatic. She declines STI screening today. Denies vaginal complaints. Insructed to f/u with PCP. ER return precautions given.  I personally performed  the services described in this documentation, which was scribed in my presence. The recorded information has been reviewed and is accurate.    Carlene Coria, PA-C 09/13/15 1428  Rolland Porter, MD 09/18/15 2312

## 2015-09-13 NOTE — ED Notes (Signed)
Right ear pain x 2 weeks, has been trying home remedies to fix without success. White drainage coming from ear canal

## 2016-01-06 ENCOUNTER — Emergency Department (HOSPITAL_COMMUNITY)
Admission: EM | Admit: 2016-01-06 | Discharge: 2016-01-06 | Disposition: A | Discharge status: Home / Self Care | Payer: Self-pay | Attending: Emergency Medicine | Admitting: Emergency Medicine

## 2016-01-06 ENCOUNTER — Emergency Department (HOSPITAL_COMMUNITY): Payer: Self-pay

## 2016-01-06 ENCOUNTER — Encounter (HOSPITAL_COMMUNITY): Payer: Self-pay

## 2016-01-06 DIAGNOSIS — Z7982 Long term (current) use of aspirin: Secondary | ICD-10-CM | POA: Insufficient documentation

## 2016-01-06 DIAGNOSIS — Y999 Unspecified external cause status: Secondary | ICD-10-CM | POA: Insufficient documentation

## 2016-01-06 DIAGNOSIS — Y939 Activity, unspecified: Secondary | ICD-10-CM | POA: Insufficient documentation

## 2016-01-06 DIAGNOSIS — S00211A Abrasion of right eyelid and periocular area, initial encounter: Secondary | ICD-10-CM | POA: Insufficient documentation

## 2016-01-06 DIAGNOSIS — S0990XA Unspecified injury of head, initial encounter: Secondary | ICD-10-CM

## 2016-01-06 DIAGNOSIS — Z79891 Long term (current) use of opiate analgesic: Secondary | ICD-10-CM | POA: Insufficient documentation

## 2016-01-06 DIAGNOSIS — F17210 Nicotine dependence, cigarettes, uncomplicated: Secondary | ICD-10-CM | POA: Insufficient documentation

## 2016-01-06 DIAGNOSIS — Y929 Unspecified place or not applicable: Secondary | ICD-10-CM | POA: Insufficient documentation

## 2016-01-06 DIAGNOSIS — S0001XA Abrasion of scalp, initial encounter: Secondary | ICD-10-CM | POA: Insufficient documentation

## 2016-01-06 LAB — I-STAT BETA HCG BLOOD, ED (MC, WL, AP ONLY): I-stat hCG, quantitative: <5 m[IU]/mL (ref <5)

## 2016-01-06 MED ORDER — SODIUM CHLORIDE 0.9 % IV BOLUS (SEPSIS)
1000.0000 mL | Freq: Once | INTRAVENOUS | Status: AC
  Administered 2016-01-06: 1000 mL via INTRAVENOUS

## 2016-01-06 MED ORDER — KETOROLAC TROMETHAMINE 30 MG/ML IJ SOLN
30.0000 mg | Freq: Once | INTRAMUSCULAR | Status: AC
  Administered 2016-01-06: 30 mg via INTRAVENOUS
  Filled 2016-01-06: qty 1

## 2016-01-06 NOTE — ED Notes (Signed)
Patient is alert and oriented. Able to follow commands and answer questions appropriately.

## 2016-01-06 NOTE — ED Notes (Signed)
French Ana g [employer] number [phone]  Mother ---------------------------------------

## 2016-01-06 NOTE — Discharge Instructions (Signed)
Your exam and imaging of your head, chest and wrist are all reassuring. It is important for you to follow-up with your doctor in the next 2-3 days reevaluation. Continue taking Tylenol and Motrin for discomfort. Return to ED for any new or worsening symptoms as we discussed.

## 2016-01-06 NOTE — ED Notes (Signed)
PA at bedside.
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
## 2016-01-06 NOTE — ED Notes (Signed)
Patient offered wheelchair. Patient declined. 

## 2016-01-06 NOTE — ED Notes (Signed)
Patient given gingerale for PO challenge.
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
## 2016-09-16 ENCOUNTER — Encounter (HOSPITAL_COMMUNITY): Payer: Self-pay | Admitting: Emergency Medicine

## 2016-09-16 ENCOUNTER — Emergency Department (HOSPITAL_COMMUNITY)
Admission: EM | Admit: 2016-09-16 | Discharge: 2016-09-16 | Disposition: A | Discharge status: Home / Self Care | Payer: Self-pay | Attending: Emergency Medicine | Admitting: Emergency Medicine

## 2016-09-16 DIAGNOSIS — L259 Unspecified contact dermatitis, unspecified cause: Secondary | ICD-10-CM | POA: Insufficient documentation

## 2016-09-16 DIAGNOSIS — R21 Rash and other nonspecific skin eruption: Secondary | ICD-10-CM

## 2016-09-16 DIAGNOSIS — F17210 Nicotine dependence, cigarettes, uncomplicated: Secondary | ICD-10-CM | POA: Insufficient documentation

## 2016-09-16 DIAGNOSIS — Z79899 Other long term (current) drug therapy: Secondary | ICD-10-CM | POA: Insufficient documentation

## 2016-09-16 DIAGNOSIS — Z7982 Long term (current) use of aspirin: Secondary | ICD-10-CM | POA: Insufficient documentation

## 2016-09-16 DIAGNOSIS — B86 Scabies: Secondary | ICD-10-CM | POA: Insufficient documentation

## 2016-09-16 MED ORDER — PERMETHRIN 5 % EX CREA: TOPICAL_CREAM | CUTANEOUS | 0 refills | Status: DC

## 2016-09-16 NOTE — Discharge Instructions (Signed)
Use over the counter hydrocortisone cream as needed for itching. Consider using over the counter benadryl or other antihistamines as needed for itching. Use permethrin cream as directed. WASH ALL SHEETS/CLOTHING/LINENS/BEDDING IN HOT WATER; if you can't wash a linen item, then wrap it in plastic for 24 hours Continue your usual home medications. Get plenty of rest and drink plenty of fluids. Avoid any known triggers. Please followup with your primary doctor in 1 week for recheck of symptoms. Return to the ER for changes or worsening symptoms

## 2016-09-16 NOTE — ED Notes (Signed)
Patient was alert, oriented and stable upon discharge. RN went over AVS and patient had no further questions.  
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
## 2016-09-16 NOTE — ED Triage Notes (Signed)
Pt c/o rash covering body. Pt reports a few days noticing small red bumps on foot and now experiencing on all extremities and trunk.

## 2017-06-14 NOTE — L&D Delivery Note (Signed)
Delivery Note Pt pushed very well with 1 contraction for delivery.  At 4:33 PM a viable and healthy female was delivered via Vaginal, Spontaneous (Presentation: OA ).  APGAR: 7, 8; weight 7 lb 4 oz (3289 g).   Placenta status:delivered,intact .  Cord: 3V  with the following complications: body nuchal and nuchal.   Anesthesia:  epidural Episiotomy: None Lacerations: B Labial, periclitoral Suture Repair: 3.0 vicryl rapide Est. Blood Loss (mL): 566  Mom to postpartum.  Baby to Couplet care / Skin to Skin.  [name] [name] 03/17/2018, 6:25 PM  Br/RI/Tdap in PNC/A+/desires BTL - papers signed  D/w pt r/b/a of circumcision for female infant - will do in office  D/W pt r/b/a of PP BTL, will proceed this PM

## 2017-10-26 LAB — OB RESULTS CONSOLE HEPATITIS B SURFACE ANTIGEN: HEP B S AG: NEGATIVE

## 2017-10-26 LAB — OB RESULTS CONSOLE RUBELLA ANTIBODY, IGM: Rubella: IMMUNE

## 2017-10-26 LAB — OB RESULTS CONSOLE ABO/RH: RH TYPE: POSITIVE

## 2017-10-26 LAB — OB RESULTS CONSOLE ANTIBODY SCREEN: ANTIBODY SCREEN: NEGATIVE

## 2017-10-26 LAB — OB RESULTS CONSOLE HIV ANTIBODY (ROUTINE TESTING): HIV: NONREACTIVE

## 2017-10-26 LAB — OB RESULTS CONSOLE RPR: RPR: NONREACTIVE

## 2018-02-23 LAB — OB RESULTS CONSOLE GBS: GBS: NEGATIVE

## 2018-03-03 ENCOUNTER — Inpatient Hospital Stay (HOSPITAL_COMMUNITY)
Admission: AD | Admit: 2018-03-03 | Discharge: 2018-03-03 | Disposition: A | Discharge status: Home / Self Care | Payer: Medicaid Other | Source: Ambulatory Visit | Attending: Obstetrics and Gynecology | Admitting: Obstetrics and Gynecology

## 2018-03-03 ENCOUNTER — Encounter (HOSPITAL_COMMUNITY): Payer: Self-pay | Admitting: *Deleted

## 2018-03-03 ENCOUNTER — Telehealth (HOSPITAL_COMMUNITY): Payer: Self-pay | Admitting: *Deleted

## 2018-03-03 DIAGNOSIS — O26893 Other specified pregnancy related conditions, third trimester: Secondary | ICD-10-CM | POA: Diagnosis not present

## 2018-03-03 DIAGNOSIS — Z880 Allergy status to penicillin: Secondary | ICD-10-CM | POA: Diagnosis not present

## 2018-03-03 DIAGNOSIS — O471 False labor at or after 37 completed weeks of gestation: Secondary | ICD-10-CM

## 2018-03-03 DIAGNOSIS — Z7982 Long term (current) use of aspirin: Secondary | ICD-10-CM | POA: Insufficient documentation

## 2018-03-03 DIAGNOSIS — H6693 Otitis media, unspecified, bilateral: Secondary | ICD-10-CM

## 2018-03-03 DIAGNOSIS — F17210 Nicotine dependence, cigarettes, uncomplicated: Secondary | ICD-10-CM | POA: Insufficient documentation

## 2018-03-03 DIAGNOSIS — Z3A37 37 weeks gestation of pregnancy: Secondary | ICD-10-CM | POA: Diagnosis not present

## 2018-03-03 DIAGNOSIS — O99333 Smoking (tobacco) complicating pregnancy, third trimester: Secondary | ICD-10-CM | POA: Diagnosis not present

## 2018-03-03 DIAGNOSIS — R109 Unspecified abdominal pain: Secondary | ICD-10-CM | POA: Diagnosis present

## 2018-03-03 MED ORDER — AZITHROMYCIN 250 MG PO TABS
500.0000 mg | ORAL_TABLET | Freq: Once | ORAL | Status: AC
  Administered 2018-03-03: 500 mg via ORAL
  Filled 2018-03-03: qty 2

## 2018-03-03 MED ORDER — AZITHROMYCIN 250 MG PO TABS: ORAL_TABLET | ORAL | 0 refills | Status: DC

## 2018-03-03 NOTE — MAU Note (Addendum)
Contractions since yesterday, denies bleeding or leaking of fluid and ear infection

## 2018-03-03 NOTE — Discharge Instructions (Signed)

## 2018-03-03 NOTE — MAU Provider Note (Signed)
Chief Complaint:  Contractions   First Provider Initiated Contact with Patient 03/03/18 2155      HPI: [name] [name] is a 27 y.o. G2P1001 at 37w3 d who presents to maternity admissions reporting cramping/contractions worsening over the last few hours and right sided ear pain x 3 days keeping her up at night. She reports onset of sharp pain inside her ear and radiating down into her neck starting 3 days ago. The pain is constant but waxes and wanes in intensity. She has taken Tylenol which has not helped. She denies any other respiratory symptoms or sick contacts. She had some ear infections as an adult at age 26-18 but none since.  There are no other symptoms. She has not tried any other treatments. She reports good fetal movement.  HPI  Past Medical History: Past Medical History:  Diagnosis Date  . Anxiety   . Mood disorder (HCC)   . MRSA (methicillin resistant Staphylococcus aureus) 2011  . Normal pregnancy, first 07/06/2011  . 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 Pyelonephritis complicating pregnancy   . SVD (spontaneous vaginal delivery) 07/07/2011  . Urinary tract infection    frequent    Past obstetric history: OB History  Gravida Para Term Preterm AB Living  2 1 1     1   SAB TAB Ectopic Multiple Live Births          1    # Outcome Date GA Lbr Len/2nd Weight Sex Delivery Anes PTL Lv  2 Current           1 Term 07/07/11 5251w1d 05:01 / 00:24 3855 g M Vag-Spont EPI  LIV    Past Surgical History: Past Surgical History:  Procedure Laterality Date  . CYSTOSCOPY     ? not sure, had blocked ureter at age 89wks    Family History: Family History  Problem Relation Age of Onset  . Depression Mother   . Alcohol abuse Father   . Mental illness Father        bipolar  . Cancer Paternal Aunt        cervix  . Depression Maternal Grandmother        depression  . Arthritis Maternal Grandmother   . Mental illness Paternal Grandmother        bipolar  .  Cancer Paternal Grandmother        cervix  . Anesthesia problems Neg Hx     Social History: Social History   Tobacco Use  . Smoking status: Current Every Day Smoker    Packs/day: 0.50    Years: 5.00    Pack years: 2.50    Types: Cigarettes    Last attempt to quit: 01/11/2011    Years since quitting: 7.1  . Smokeless tobacco: Never Used  Substance Use Topics  . Alcohol use: Yes    Alcohol/week: 28.0 standard drinks    Types: 14 Glasses of wine, 14 Standard drinks or equivalent per week    Comment: prior to preg  . Drug use: No    Comment: Had admissions in 2010 and 2011 for Drug use and overdose    Allergies:  Allergies  Allergen Reactions  . Amoxicillin-Pot Clavulanate Anaphylaxis and Hives  . Bactrim Hives  . Penicillins Hives    Has patient had a PCN reaction causing immediate rash, facial/tongue/throat swelling, SOB or lightheadedness with hypotension: yes Has patient had a PCN reaction causing severe rash involving mucus membranes or skin necrosis:  no Has patient had a PCN reaction that required hospitalization : yes, at MD office Has patient had a PCN reaction occurring within the last 10 years: yes If all of the above answers are "NO", then may proceed with Cephalosporin use.     Meds:  Medications Prior to Admission  Medication Sig Dispense Refill Last Dose  . acetaminophen (TYLENOL) 325 MG tablet Take 975 mg by mouth every 6 (six) hours as needed for mild pain.   unknown  . Aspirin-Salicylamide-Caffeine (BC HEADACHE POWDER PO) Take 1 packet by mouth daily as needed (headache).   unknown  . azithromycin (ZITHROMAX) 250 MG tablet Take 1 tablet (250 mg total) by mouth daily. Take first 2 tablets together, then 1 every day until finished. (Patient not taking: Reported on 01/06/2016) 6 tablet 0 Completed Course at Unknown time  . ofloxacin (FLOXIN) 0.3 % otic solution Place 5 drops into the right ear 2 (two) times daily. (Patient not taking: Reported on 01/06/2016) 5 mL  0 Completed Course at Unknown time  . permethrin (ELIMITE) 5 % cream Apply a generous amount of cream from head to feet, leave on for 8 to 14 hours, wash with soap/water 60 g 0     ROS:  Review of Systems  Constitutional: Negative for chills, fatigue and fever.  HENT: Positive for ear pain and hearing loss. Negative for congestion, ear discharge, rhinorrhea, sinus pressure, sinus pain, sore throat, tinnitus and trouble swallowing.   Eyes: Negative for visual disturbance.  Respiratory: Negative for shortness of breath.   Cardiovascular: Negative for chest pain.  Gastrointestinal: Positive for abdominal pain. Negative for nausea and vomiting.  Genitourinary: Negative for difficulty urinating, dysuria, flank pain, pelvic pain, vaginal bleeding, vaginal discharge and vaginal pain.  Neurological: Negative for dizziness and headaches.  Psychiatric/Behavioral: Negative.      I have reviewed patient's Past Medical Hx, Surgical Hx, Family Hx, Social Hx, medications and allergies.   Physical Exam   Patient Vitals for the past 24 hrs:  BP Temp Temp src Pulse Resp  03/03/18 2136 105/60 97.6 F (36.4 C) Oral 90 18   Constitutional: Well-developed, well-nourished female in no acute distress.  HEENT: Left ear canal with mild erythema, tympanic membrane gray, intact, wnl.  Right ear canal with significant erythema, thin yellow exudate, tympanic membrane intact, pockmarked and uneven in texture Cardiovascular: normal rate Respiratory: normal effort GI: Abd soft, non-tender, gravid appropriate for gestational age.  MS: Extremities nontender, no edema, normal ROM Neurologic: Alert and oriented x 4.  GU: Neg CVAT.   Dilation: 1 Effacement (%): Thick Cervical Position: Middle Station: -2 Presentation: Vertex Exam by:: [email or web address]-[email or web address], rnc   FHT:  Baseline 135 , moderate variability, accelerations present, no decelerations Contractions: q 20 mins, mild to palpation   Labs: No results found for  this or any previous visit (from the past 24 hour(s)). A/Positive/-- (05/15 0000)  Imaging:  No results found.  MAU Course/MDM: I have ordered labs and reviewed results.  NST reviewed and reactive Evidence of bilateral otitis media, worse on right side.  Severity of pain and visual exam indicate need for abx therapy.  Pt with PCN and augmentin allergy so will start Z-pak with first dose here tonight. Azithromycin 500 mg PO x 1 dose in MAU May use warm saltwater or hydrogen peroxide cut with warm water as ear drops BID until symptoms improve Consult Richardson with presentation, exam findings and test results.  Rx for Z-pak, start again with 500 mg tomorrow, then 250  daily x 4 days. F/U in office as scheduled, return to MAU for signs of labor or emergencies Pt discharge with strict return precautions.  Assessment: 1. Bilateral acute otitis media   2. False labor after 37 completed weeks of gestation     Plan: Discharge home Labor precautions and fetal kick counts Follow-up Information    [facility] Follow up.   Why:  As scheduled, return to MAU with signs of labor or emergencies Contact information: [address]  [address] [place] [address] [phone]          Allergies as of 03/03/2018      Reactions   Amoxicillin-pot Clavulanate Anaphylaxis, Hives   Bactrim Hives   Penicillins Hives   Has patient had a PCN reaction causing immediate rash, facial/tongue/throat swelling, SOB or lightheadedness with hypotension: yes Has patient had a PCN reaction causing severe rash involving mucus membranes or skin necrosis: no Has patient had a PCN reaction that required hospitalization : yes, at MD office Has patient had a PCN reaction occurring within the last 10 years: yes If all of the above answers are "NO", then may proceed with Cephalosporin use.      Medication List    STOP taking these medications   BC HEADACHE POWDER PO   ofloxacin 0.3 % OTIC  solution Commonly known as:  FLOXIN   permethrin 5 % cream Commonly known as:  ELIMITE     TAKE these medications   acetaminophen 325 MG tablet Commonly known as:  TYLENOL Take 975 mg by mouth every 6 (six) hours as needed for mild pain.   azithromycin 250 MG tablet Commonly known as:  ZITHROMAX Take 2 tablets on day one then 1 tablet daily for the next 4 days. What changed:    how much to take  how to take this  when to take this  additional instructions       Sharen Counter Certified Nurse-Midwife 03/03/2018 10:36 PM

## 2018-03-06 NOTE — Telephone Encounter (Signed)
Preadmission screen  

## 2018-03-17 ENCOUNTER — Inpatient Hospital Stay (HOSPITAL_COMMUNITY): Payer: Medicaid Other | Admitting: Anesthesiology

## 2018-03-17 ENCOUNTER — Encounter (HOSPITAL_COMMUNITY): Payer: Self-pay

## 2018-03-17 ENCOUNTER — Inpatient Hospital Stay (HOSPITAL_COMMUNITY)
Admission: RE | Admit: 2018-03-17 | Discharge: 2018-03-18 | DRG: 798 | Disposition: A | Discharge status: Home / Self Care | Payer: Medicaid Other | Attending: Obstetrics and Gynecology | Admitting: Obstetrics and Gynecology

## 2018-03-17 ENCOUNTER — Encounter (HOSPITAL_COMMUNITY)
Admission: RE | Disposition: A | Discharge status: Home / Self Care | Payer: Self-pay | Source: Home / Self Care | Attending: Obstetrics and Gynecology

## 2018-03-17 DIAGNOSIS — Z3A39 39 weeks gestation of pregnancy: Secondary | ICD-10-CM

## 2018-03-17 DIAGNOSIS — Z880 Allergy status to penicillin: Secondary | ICD-10-CM

## 2018-03-17 DIAGNOSIS — F17210 Nicotine dependence, cigarettes, uncomplicated: Secondary | ICD-10-CM | POA: Diagnosis present

## 2018-03-17 DIAGNOSIS — Z9851 Tubal ligation status: Secondary | ICD-10-CM

## 2018-03-17 DIAGNOSIS — Z3483 Encounter for supervision of other normal pregnancy, third trimester: Secondary | ICD-10-CM

## 2018-03-17 DIAGNOSIS — Z302 Encounter for sterilization: Secondary | ICD-10-CM

## 2018-03-17 DIAGNOSIS — D649 Anemia, unspecified: Secondary | ICD-10-CM | POA: Diagnosis present

## 2018-03-17 DIAGNOSIS — Z8614 Personal history of Methicillin resistant Staphylococcus aureus infection: Secondary | ICD-10-CM | POA: Diagnosis not present

## 2018-03-17 DIAGNOSIS — O9902 Anemia complicating childbirth: Secondary | ICD-10-CM | POA: Diagnosis present

## 2018-03-17 DIAGNOSIS — O99334 Smoking (tobacco) complicating childbirth: Secondary | ICD-10-CM | POA: Diagnosis present

## 2018-03-17 HISTORY — DX: Tubal ligation status: Z98.51

## 2018-03-17 HISTORY — PX: TUBAL LIGATION: SHX77

## 2018-03-17 LAB — CBC
HCT: 31 % — ABNORMAL LOW (ref 36.0–46.0)
Hemoglobin: 10.7 g/dL — ABNORMAL LOW (ref 12.0–15.0)
MCH: 32.6 pg (ref 26.0–34.0)
MCHC: 34.5 g/dL (ref 30.0–36.0)
MCV: 94.5 fL (ref 78.0–100.0)
PLATELETS: 211 10*3/uL (ref 150–400)
RBC: 3.28 MIL/uL — AB (ref 3.87–5.11)
RDW: 12.1 % (ref 11.5–15.5)
WBC: 7.6 10*3/uL (ref 4.0–10.5)

## 2018-03-17 LAB — TYPE AND SCREEN
ABO/RH(D): A POS
Antibody Screen: NEGATIVE

## 2018-03-17 LAB — ABO/RH: ABO/RH(D): A POS

## 2018-03-17 LAB — RPR: RPR: NONREACTIVE

## 2018-03-17 SURGERY — LIGATION, FALLOPIAN TUBE, POSTPARTUM
Anesthesia: Epidural | Laterality: Bilateral | Wound class: Clean Contaminated

## 2018-03-17 MED ORDER — OXYTOCIN 40 UNITS IN LACTATED RINGERS INFUSION - SIMPLE MED: 2.5000 [IU]/h | INTRAVENOUS | Status: DC

## 2018-03-17 MED ORDER — LACTATED RINGERS IV SOLN
INTRAVENOUS | Status: DC
  Administered 2018-03-17: 19:00:00 via INTRAVENOUS

## 2018-03-17 MED ORDER — ONDANSETRON HCL 4 MG PO TABS: 4.0000 mg | ORAL_TABLET | ORAL | Status: DC | PRN

## 2018-03-17 MED ORDER — FENTANYL CITRATE (PF) 250 MCG/5ML IJ SOLN
INTRAMUSCULAR | Status: AC
  Filled 2018-03-17: qty 5

## 2018-03-17 MED ORDER — SODIUM CHLORIDE 0.9 % IR SOLN
Status: DC | PRN
  Administered 2018-03-17: 1000 mL

## 2018-03-17 MED ORDER — MIDAZOLAM HCL 2 MG/2ML IJ SOLN
INTRAMUSCULAR | Status: AC
  Filled 2018-03-17: qty 2

## 2018-03-17 MED ORDER — ONDANSETRON HCL 4 MG/2ML IJ SOLN: 4.0000 mg | Freq: Three times a day (TID) | INTRAMUSCULAR | Status: DC | PRN

## 2018-03-17 MED ORDER — OXYCODONE HCL 5 MG PO TABS: 5.0000 mg | ORAL_TABLET | ORAL | Status: DC | PRN

## 2018-03-17 MED ORDER — LIDOCAINE HCL (PF) 1 % IJ SOLN
INTRAMUSCULAR | Status: DC | PRN
  Administered 2018-03-17: 4 mL via EPIDURAL
  Administered 2018-03-17: 3 mL via EPIDURAL

## 2018-03-17 MED ORDER — KETOROLAC TROMETHAMINE 30 MG/ML IJ SOLN: 30.0000 mg | Freq: Four times a day (QID) | INTRAMUSCULAR | Status: DC | PRN

## 2018-03-17 MED ORDER — FENTANYL CITRATE (PF) 100 MCG/2ML IJ SOLN
INTRAMUSCULAR | Status: DC | PRN
  Administered 2018-03-17: 50 ug via INTRAVENOUS
  Administered 2018-03-17: 100 ug via INTRAVENOUS
  Administered 2018-03-17: 50 ug via INTRAVENOUS

## 2018-03-17 MED ORDER — ONDANSETRON HCL 4 MG/2ML IJ SOLN
INTRAMUSCULAR | Status: AC
  Filled 2018-03-17: qty 2

## 2018-03-17 MED ORDER — PRENATAL MULTIVITAMIN CH
1.0000 | ORAL_TABLET | Freq: Every day | ORAL | Status: DC
  Administered 2018-03-18: 1 via ORAL
  Filled 2018-03-17: qty 1

## 2018-03-17 MED ORDER — SCOPOLAMINE 1 MG/3DAYS TD PT72
1.0000 | MEDICATED_PATCH | Freq: Once | TRANSDERMAL | Status: DC
  Filled 2018-03-17: qty 1

## 2018-03-17 MED ORDER — SODIUM BICARBONATE 8.4 % IV SOLN
INTRAVENOUS | Status: AC
  Filled 2018-03-17: qty 50

## 2018-03-17 MED ORDER — METOCLOPRAMIDE HCL 10 MG PO TABS
10.0000 mg | ORAL_TABLET | Freq: Once | ORAL | Status: AC
  Administered 2018-03-17: 10 mg via ORAL
  Filled 2018-03-17: qty 1

## 2018-03-17 MED ORDER — NALOXONE HCL 0.4 MG/ML IJ SOLN: 0.4000 mg | INTRAMUSCULAR | Status: DC | PRN

## 2018-03-17 MED ORDER — COCONUT OIL OIL: 1.0000 "application " | TOPICAL_OIL | Status: DC | PRN

## 2018-03-17 MED ORDER — SODIUM BICARBONATE 8.4 % IV SOLN
INTRAVENOUS | Status: DC | PRN
  Administered 2018-03-17 (×3): 5 mL via EPIDURAL

## 2018-03-17 MED ORDER — CALCIUM CARBONATE ANTACID 500 MG PO CHEW: 2.0000 | CHEWABLE_TABLET | ORAL | Status: DC | PRN

## 2018-03-17 MED ORDER — DIPHENHYDRAMINE HCL 25 MG PO CAPS: 25.0000 mg | ORAL_CAPSULE | ORAL | Status: DC | PRN

## 2018-03-17 MED ORDER — FENTANYL 2.5 MCG/ML BUPIVACAINE 1/10 % EPIDURAL INFUSION (WH - ANES)
14.0000 mL/h | INTRAMUSCULAR | Status: DC | PRN
  Administered 2018-03-17: 13 mL/h via EPIDURAL
  Filled 2018-03-17: qty 100

## 2018-03-17 MED ORDER — LACTATED RINGERS IV SOLN: 500.0000 mL | Freq: Once | INTRAVENOUS | Status: DC

## 2018-03-17 MED ORDER — ONDANSETRON HCL 4 MG/2ML IJ SOLN: 4.0000 mg | Freq: Four times a day (QID) | INTRAMUSCULAR | Status: DC | PRN

## 2018-03-17 MED ORDER — OXYCODONE HCL 5 MG PO TABS
10.0000 mg | ORAL_TABLET | ORAL | Status: DC | PRN
  Administered 2018-03-18 (×5): 10 mg via ORAL
  Filled 2018-03-17 (×5): qty 2

## 2018-03-17 MED ORDER — EPHEDRINE 5 MG/ML INJ
10.0000 mg | INTRAVENOUS | Status: DC | PRN
  Filled 2018-03-17: qty 2

## 2018-03-17 MED ORDER — OXYCODONE-ACETAMINOPHEN 5-325 MG PO TABS: 1.0000 | ORAL_TABLET | ORAL | Status: DC | PRN

## 2018-03-17 MED ORDER — BUPIVACAINE HCL (PF) 0.25 % IJ SOLN
INTRAMUSCULAR | Status: AC
  Filled 2018-03-17: qty 20

## 2018-03-17 MED ORDER — SIMETHICONE 80 MG PO CHEW
80.0000 mg | CHEWABLE_TABLET | ORAL | Status: DC | PRN
  Administered 2018-03-18: 80 mg via ORAL
  Filled 2018-03-17: qty 1

## 2018-03-17 MED ORDER — LACTATED RINGERS IV SOLN
INTRAVENOUS | Status: DC
  Administered 2018-03-17 (×2): via INTRAVENOUS

## 2018-03-17 MED ORDER — PHENYLEPHRINE 40 MCG/ML (10ML) SYRINGE FOR IV PUSH (FOR BLOOD PRESSURE SUPPORT)
80.0000 ug | PREFILLED_SYRINGE | INTRAVENOUS | Status: DC | PRN
  Filled 2018-03-17: qty 5

## 2018-03-17 MED ORDER — DIPHENHYDRAMINE HCL 50 MG/ML IJ SOLN: 12.5000 mg | INTRAMUSCULAR | Status: DC | PRN

## 2018-03-17 MED ORDER — NALBUPHINE HCL 10 MG/ML IJ SOLN: 5.0000 mg | Freq: Once | INTRAMUSCULAR | Status: DC | PRN

## 2018-03-17 MED ORDER — CEPHALEXIN 250 MG PO CAPS
250.0000 mg | ORAL_CAPSULE | Freq: Four times a day (QID) | ORAL | Status: DC
  Administered 2018-03-17 – 2018-03-18 (×4): 250 mg via ORAL

## 2018-03-17 MED ORDER — TERBUTALINE SULFATE 1 MG/ML IJ SOLN
0.2500 mg | Freq: Once | INTRAMUSCULAR | Status: DC | PRN
  Filled 2018-03-17: qty 1

## 2018-03-17 MED ORDER — OXYTOCIN BOLUS FROM INFUSION
500.0000 mL | Freq: Once | INTRAVENOUS | Status: AC
  Administered 2018-03-17: 500 mL via INTRAVENOUS

## 2018-03-17 MED ORDER — ZOLPIDEM TARTRATE 5 MG PO TABS: 5.0000 mg | ORAL_TABLET | Freq: Every evening | ORAL | Status: DC | PRN

## 2018-03-17 MED ORDER — IBUPROFEN 600 MG PO TABS
600.0000 mg | ORAL_TABLET | Freq: Four times a day (QID) | ORAL | Status: DC
  Administered 2018-03-17 – 2018-03-18 (×3): 600 mg via ORAL
  Filled 2018-03-17 (×3): qty 1

## 2018-03-17 MED ORDER — OXYCODONE-ACETAMINOPHEN 5-325 MG PO TABS: 2.0000 | ORAL_TABLET | ORAL | Status: DC | PRN

## 2018-03-17 MED ORDER — WITCH HAZEL-GLYCERIN EX PADS: 1.0000 "application " | MEDICATED_PAD | CUTANEOUS | Status: DC | PRN

## 2018-03-17 MED ORDER — NALBUPHINE HCL 10 MG/ML IJ SOLN: 5.0000 mg | INTRAMUSCULAR | Status: DC | PRN

## 2018-03-17 MED ORDER — NALOXONE HCL 4 MG/10ML IJ SOLN
1.0000 ug/kg/h | INTRAVENOUS | Status: DC | PRN
  Filled 2018-03-17: qty 5

## 2018-03-17 MED ORDER — CEPHALEXIN 250 MG PO CAPS
250.0000 mg | ORAL_CAPSULE | Freq: Three times a day (TID) | ORAL | Status: DC
  Filled 2018-03-17 (×2): qty 1

## 2018-03-17 MED ORDER — MIDAZOLAM HCL 5 MG/5ML IJ SOLN
INTRAMUSCULAR | Status: DC | PRN
  Administered 2018-03-17 (×2): 1 mg via INTRAVENOUS

## 2018-03-17 MED ORDER — LIDOCAINE HCL (PF) 1 % IJ SOLN
30.0000 mL | INTRAMUSCULAR | Status: DC | PRN
  Filled 2018-03-17: qty 30

## 2018-03-17 MED ORDER — SENNOSIDES-DOCUSATE SODIUM 8.6-50 MG PO TABS
2.0000 | ORAL_TABLET | ORAL | Status: DC
  Administered 2018-03-18: 2 via ORAL
  Filled 2018-03-17: qty 2

## 2018-03-17 MED ORDER — FAMOTIDINE 20 MG PO TABS
40.0000 mg | ORAL_TABLET | Freq: Once | ORAL | Status: AC
  Administered 2018-03-17: 40 mg via ORAL
  Filled 2018-03-17: qty 2

## 2018-03-17 MED ORDER — DIPHENHYDRAMINE HCL 25 MG PO CAPS: 25.0000 mg | ORAL_CAPSULE | Freq: Four times a day (QID) | ORAL | Status: DC | PRN

## 2018-03-17 MED ORDER — BUTORPHANOL TARTRATE 1 MG/ML IJ SOLN
1.0000 mg | INTRAMUSCULAR | Status: DC | PRN
  Administered 2018-03-17: 1 mg via INTRAVENOUS
  Filled 2018-03-17: qty 1

## 2018-03-17 MED ORDER — SODIUM CHLORIDE 0.9% FLUSH: 3.0000 mL | INTRAVENOUS | Status: DC | PRN

## 2018-03-17 MED ORDER — SOD CITRATE-CITRIC ACID 500-334 MG/5ML PO SOLN: 30.0000 mL | ORAL | Status: DC | PRN

## 2018-03-17 MED ORDER — TETANUS-DIPHTH-ACELL PERTUSSIS 5-2.5-18.5 LF-MCG/0.5 IM SUSP: 0.5000 mL | Freq: Once | INTRAMUSCULAR | Status: DC

## 2018-03-17 MED ORDER — ACETAMINOPHEN 325 MG PO TABS
650.0000 mg | ORAL_TABLET | ORAL | Status: DC | PRN
  Administered 2018-03-18 (×2): 650 mg via ORAL
  Filled 2018-03-17 (×2): qty 2

## 2018-03-17 MED ORDER — KETOROLAC TROMETHAMINE 30 MG/ML IJ SOLN
INTRAMUSCULAR | Status: AC
  Filled 2018-03-17: qty 1

## 2018-03-17 MED ORDER — SERTRALINE HCL 25 MG PO TABS
25.0000 mg | ORAL_TABLET | Freq: Every day | ORAL | Status: DC
  Administered 2018-03-18: 25 mg via ORAL
  Filled 2018-03-17 (×2): qty 1

## 2018-03-17 MED ORDER — BUPIVACAINE HCL (PF) 0.25 % IJ SOLN
INTRAMUSCULAR | Status: DC | PRN
  Administered 2018-03-17: 30 mL

## 2018-03-17 MED ORDER — DIBUCAINE 1 % RE OINT: 1.0000 "application " | TOPICAL_OINTMENT | RECTAL | Status: DC | PRN

## 2018-03-17 MED ORDER — LACTATED RINGERS IV SOLN
INTRAVENOUS | Status: DC | PRN
  Administered 2018-03-17: 21:00:00 via INTRAVENOUS

## 2018-03-17 MED ORDER — OXYTOCIN 40 UNITS IN LACTATED RINGERS INFUSION - SIMPLE MED
1.0000 m[IU]/min | INTRAVENOUS | Status: DC
  Administered 2018-03-17: 16 m[IU]/min via INTRAVENOUS
  Administered 2018-03-17: 2 m[IU]/min via INTRAVENOUS
  Filled 2018-03-17: qty 1000

## 2018-03-17 MED ORDER — PHENYLEPHRINE 40 MCG/ML (10ML) SYRINGE FOR IV PUSH (FOR BLOOD PRESSURE SUPPORT)
80.0000 ug | PREFILLED_SYRINGE | INTRAVENOUS | Status: DC | PRN
  Filled 2018-03-17: qty 10
  Filled 2018-03-17: qty 5

## 2018-03-17 MED ORDER — ACETAMINOPHEN 325 MG PO TABS: 650.0000 mg | ORAL_TABLET | ORAL | Status: DC | PRN

## 2018-03-17 MED ORDER — LACTATED RINGERS IV SOLN: INTRAVENOUS | Status: DC

## 2018-03-17 MED ORDER — DEXAMETHASONE SODIUM PHOSPHATE 4 MG/ML IJ SOLN
INTRAMUSCULAR | Status: DC | PRN
  Administered 2018-03-17: 4 mg via INTRAVENOUS

## 2018-03-17 MED ORDER — DEXAMETHASONE SODIUM PHOSPHATE 4 MG/ML IJ SOLN
INTRAMUSCULAR | Status: AC
  Filled 2018-03-17: qty 1

## 2018-03-17 MED ORDER — ONDANSETRON HCL 4 MG/2ML IJ SOLN: 4.0000 mg | INTRAMUSCULAR | Status: DC | PRN

## 2018-03-17 MED ORDER — BUPIVACAINE HCL (PF) 0.25 % IJ SOLN
INTRAMUSCULAR | Status: AC
  Filled 2018-03-17: qty 10

## 2018-03-17 MED ORDER — BENZOCAINE-MENTHOL 20-0.5 % EX AERO: 1.0000 "application " | INHALATION_SPRAY | CUTANEOUS | Status: DC | PRN

## 2018-03-17 MED ORDER — LACTATED RINGERS IV SOLN: 500.0000 mL | INTRAVENOUS | Status: DC | PRN

## 2018-03-17 MED ORDER — ONDANSETRON HCL 4 MG/2ML IJ SOLN
INTRAMUSCULAR | Status: DC | PRN
  Administered 2018-03-17: 4 mg via INTRAVENOUS

## 2018-03-17 MED ORDER — LIDOCAINE-EPINEPHRINE (PF) 2 %-1:200000 IJ SOLN
INTRAMUSCULAR | Status: AC
  Filled 2018-03-17: qty 40

## 2018-03-17 SURGICAL SUPPLY — 32 items
ADH SKN CLS APL DERMABOND .7 (GAUZE/BANDAGES/DRESSINGS) ×1
APL SKNCLS STERI-STRIP NONHPOA (GAUZE/BANDAGES/DRESSINGS) ×1
BENZOIN TINCTURE PRP APPL 2/3 (GAUZE/BANDAGES/DRESSINGS) ×3 IMPLANT
BLADE SURG 11 STRL SS (BLADE) ×3 IMPLANT
CLOSURE STERI STRIP 1/2 X4 (GAUZE/BANDAGES/DRESSINGS) ×2 IMPLANT
CLOTH BEACON ORANGE TIMEOUT ST (SAFETY) ×3 IMPLANT
DERMABOND ADVANCED (GAUZE/BANDAGES/DRESSINGS) ×2
DERMABOND ADVANCED .7 DNX12 (GAUZE/BANDAGES/DRESSINGS) IMPLANT
DRSG OPSITE 4X5.5 SM (GAUZE/BANDAGES/DRESSINGS) ×2 IMPLANT
DRSG OPSITE POSTOP 3X4 (GAUZE/BANDAGES/DRESSINGS) ×3 IMPLANT
DURAPREP 26ML APPLICATOR (WOUND CARE) ×3 IMPLANT
ELECT REM PT RETURN 9FT ADLT (ELECTROSURGICAL) ×3
ELECTRODE REM PT RTRN 9FT ADLT (ELECTROSURGICAL) ×1 IMPLANT
GLOVE BIO SURGEON STRL SZ 6.5 (GLOVE) ×4 IMPLANT
GLOVE BIO SURGEONS STRL SZ 6.5 (GLOVE) ×2
GLOVE BIOGEL PI IND STRL 7.0 (GLOVE) ×1 IMPLANT
GLOVE BIOGEL PI INDICATOR 7.0 (GLOVE) ×2
GOWN STRL REUS W/TWL LRG LVL3 (GOWN DISPOSABLE) ×6 IMPLANT
NEEDLE HYPO 22GX1.5 SAFETY (NEEDLE) IMPLANT
NS IRRIG 1000ML POUR BTL (IV SOLUTION) ×3 IMPLANT
PACK ABDOMINAL MINOR (CUSTOM PROCEDURE TRAY) ×3 IMPLANT
PENCIL BUTTON HOLSTER BLD 10FT (ELECTRODE) IMPLANT
PROTECTOR NERVE ULNAR (MISCELLANEOUS) ×3 IMPLANT
SPONGE LAP 4X18 RFD (DISPOSABLE) ×3 IMPLANT
STRIP CLOSURE SKIN 1/2X4 (GAUZE/BANDAGES/DRESSINGS) ×2 IMPLANT
SUT PLAIN 0 NONE (SUTURE) ×3 IMPLANT
SUT VIC AB 0 CT1 27 (SUTURE) ×3
SUT VIC AB 0 CT1 27XBRD ANBCTR (SUTURE) ×1 IMPLANT
SUT VIC AB 3-0 FS2 27 (SUTURE) ×3 IMPLANT
SYR CONTROL 10ML LL (SYRINGE) IMPLANT
TOWEL OR 17X24 6PK STRL BLUE (TOWEL DISPOSABLE) ×6 IMPLANT
TRAY FOLEY CATH SILVER 14FR (SET/KITS/TRAYS/PACK) ×3 IMPLANT

## 2018-03-17 NOTE — Transfer of Care (Signed)
Immediate Anesthesia Transfer of Care Note  Patient: [name] [name]  Procedure(s) Performed: POST PARTUM TUBAL LIGATION (Bilateral )  Patient Location: PACU  Anesthesia Type:Epidural  Level of Consciousness: awake, alert  and oriented  Airway & Oxygen Therapy: Patient Spontanous Breathing  Post-op Assessment: Report given to RN and Post -op Vital signs reviewed and stable  Post vital signs: Reviewed and stable HR 80, RR 14, SaO2 100%, BP 108/65  Last Vitals:  Vitals Value Taken Time  BP    Temp    Pulse    Resp    SpO2      Last Pain:  Vitals:   03/17/18 1900  TempSrc: Oral  PainSc:          Complications: No apparent anesthesia complications

## 2018-03-17 NOTE — H&P (Signed)
[name] [name] is a 27 y.o. female G2P1001 at 39+ for IOL given term and favorable.  Relatively uncomplicated PNC.  Received Tdap and Flu vaccine in pregnancy.  Dated by LMP cw 15wk [place].  GBBS is negative.  Pt with bipolar, tobacco use and late entry to [facility].    OB History    Gravida  2   Para  1   Term  1   Preterm      AB      Living  1     SAB      TAB      Ectopic      Multiple      Live Births  1         no abn pap + STD - Chl remote  G1 Gunner 07/07/11 40 wk SVD 8#8 G2 present  Past Medical History:  Diagnosis Date  . Anxiety   . Mood disorder (HCC)   . MRSA (methicillin resistant Staphylococcus aureus) 2011  . Normal pregnancy, first 07/06/2011  . 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 Pyelonephritis complicating pregnancy   . SVD (spontaneous vaginal delivery) 07/07/2011  . Urinary tract infection    frequent   Past Surgical History:  Procedure Laterality Date  . CYSTOSCOPY     ? not sure, had blocked ureter at age 62wks   Family History: family history includes Alcohol abuse in her father; Arthritis in her maternal grandmother; Cancer in her paternal aunt and paternal grandmother; Depression in her maternal grandmother and mother; Mental illness in her father and paternal grandmother. Social History:  reports that she has been smoking cigarettes. She has a 2.50 pack-year smoking history. She has never used smokeless tobacco. She reports that she drinks about 28.0 standard drinks of alcohol per week. 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   HENT: Negative.   Eyes: Negative.   Respiratory: Negative.   Cardiovascular: Negative.   Gastrointestinal: Negative.   Genitourinary: Negative.   Musculoskeletal: Negative.   Skin: Negative.   Neurological: Negative.   Psychiatric/Behavioral: Negative.    Maternal Medical History:  Contractions: Frequency: irregular.    Fetal activity: Perceived fetal activity is normal.    Prenatal Complications - Diabetes: none.      Last menstrual period 06/14/2017. Maternal Exam:  Abdomen: Patient reports no abdominal tenderness. Fundal height is appropriate for gestation.   Estimated fetal weight is 8#.   Fetal presentation: vertex  Introitus: Normal vulva. Normal vagina.    Physical Exam  Constitutional: She is oriented to person, place, and time. She appears well-developed and well-nourished.  HENT:  Head: Normocephalic and atraumatic.  Cardiovascular: Normal rate and regular rhythm.  Respiratory: Effort normal and breath sounds normal. No respiratory distress. She has no wheezes.  GI: Soft. Bowel sounds are normal. There is no tenderness.  Musculoskeletal: Normal range of motion.  Neurological: She is alert and oriented to person, place, and time.  Skin: Skin is warm and dry.  Psychiatric: She has a normal mood and  affect. Her behavior is normal.    Prenatal labs: ABO, Rh: A/Positive/-- (05/15 0000) Antibody: Negative (05/15 0000) Rubella: Immune (05/15 0000) RPR: Nonreactive (05/15 0000)  HBsAg: Negative (05/15 0000)  HIV: Non-reactive (05/15 0000)  GBS: Negative (09/12 0000)   Tdap 7/19, Flu 9/19 Hgb 12.1/Plt 248/Ur Cx neg/GC neg/Chl neg/Varicella immune/glucola 143/3hr GTT WNL/  [place] cwd [place] nl anat x CP cyst, ant plac, female  Essential panel neg Assessment/Plan: 27yo G2P1001 at 39+ for IOL gbbs neg IOL w AROM and pitocin Expect SVD   [name] [name]  [name] [name] 03/17/2018, 6:31 AM

## 2018-03-17 NOTE — Progress Notes (Signed)
Patient ID: [name] [name], female   DOB: Dec 13, 1990, 27 y.o.   MRN: [id number]  H&P reviewed, no changes  AFVSS  gen NAD AROM for clear fluid w/o diff/comp 2/50/-2  FHTs 140's mod var, + accels, category 1 toco q 2-4 min  Continue IOL

## 2018-03-17 NOTE — Anesthesia Procedure Notes (Signed)
Epidural Patient location during procedure: OB Start time: 03/17/2018 10:35 AM End time: 03/17/2018 10:42 AM  Staffing Anesthesiologist: Mal Amabile, MD Performed: anesthesiologist   Preanesthetic Checklist Completed: patient identified, site marked, surgical consent, pre-op evaluation, timeout performed, IV checked, risks and benefits discussed and monitors and equipment checked  Epidural Patient position: sitting Prep: site prepped and draped and DuraPrep Patient monitoring: continuous pulse ox and blood pressure Approach: midline Location: L3-L4 Injection technique: LOR air  Needle:  Needle type: Tuohy  Needle gauge: 17 G Needle length: 9 cm and 9 Needle insertion depth: 4 cm Catheter type: closed end flexible Catheter size: 19 Gauge Catheter at skin depth: 9 cm Test dose: negative and Other  Assessment Events: blood not aspirated, injection not painful, no injection resistance, negative IV test and no paresthesia  Additional Notes Patient identified. Risks and benefits discussed including failed block, incomplete  Pain control, post dural puncture headache, nerve damage, paralysis, blood pressure Changes, nausea, vomiting, reactions to medications-both toxic and allergic and post Partum back pain. All questions were answered. Patient expressed understanding and wished to proceed. Sterile technique was used throughout procedure. Epidural site was Dressed with sterile barrier dressing. No paresthesias, signs of intravascular injection Or signs of intrathecal spread were encountered.  Patient was more comfortable after the epidural was dosed. Please see RN's note for documentation of vital signs and FHR which are stable.

## 2018-03-17 NOTE — Anesthesia Preprocedure Evaluation (Signed)
Anesthesia Evaluation  Patient identified by MRN, date of birth, ID band Patient awake    Reviewed: Allergy & Precautions, Patient's Chart, lab work & pertinent test results  Airway Mallampati: I  TM Distance: >3 FB Neck ROM: Full    Dental no notable dental hx. (+) Teeth Intact   Pulmonary Current Smoker,    Pulmonary exam normal breath sounds clear to auscultation       Cardiovascular negative cardio ROS Normal cardiovascular exam Rhythm:Regular Rate:Normal     Neuro/Psych PSYCHIATRIC DISORDERS Anxiety Bipolar Disorder negative neurological ROS     GI/Hepatic Neg liver ROS, GERD  ,  Endo/Other  negative endocrine ROS  Renal/GU Hx/o pyelonephritis with previous pregnancy  negative genitourinary   Musculoskeletal negative musculoskeletal ROS (+)   Abdominal   Peds  Hematology  (+) anemia ,   Anesthesia Other Findings Pierced upper lip and nose  Reproductive/Obstetrics (+) Pregnancy                             Lab Results  Component Value Date   WBC 7.6 03/17/2018   HGB 10.7 (L) 03/17/2018   HCT 31.0 (L) 03/17/2018   MCV 94.5 03/17/2018   PLT 211 03/17/2018    Anesthesia Physical Anesthesia Plan  ASA: III  Anesthesia Plan: Epidural   Post-op Pain Management:    Induction:   PONV Risk Score and Plan: Treatment may vary due to age or medical condition and Dexamethasone  Airway Management Planned: Natural Airway and Nasal Cannula  Additional Equipment:   Intra-op Plan:   Post-operative Plan:   Informed Consent: I have reviewed the patients History and Physical, chart, labs and discussed the procedure including the risks, benefits and alternatives for the proposed anesthesia with the patient or authorized representative who has indicated his/her understanding and acceptance.   Dental advisory given  Plan Discussed with:   Anesthesia Plan Comments:          Anesthesia Quick Evaluation

## 2018-03-17 NOTE — Anesthesia Postprocedure Evaluation (Signed)
Anesthesia Post Note  Patient: [name] [name]  Procedure(s) Performed: AN AD HOC LABOR EPIDURAL     Patient location during evaluation: Mother Baby Anesthesia Type: Epidural Level of consciousness: awake and alert and oriented Pain management: satisfactory to patient Vital Signs Assessment: post-procedure vital signs reviewed and stable Respiratory status: respiratory function stable Cardiovascular status: stable Postop Assessment: no headache, no backache, epidural receding, patient able to bend at knees, no signs of nausea or vomiting and adequate PO intake Anesthetic complications: no    Last Vitals:  Vitals:   03/17/18 1747 03/17/18 1800  BP: 100/79 114/72  Pulse: 77 76  Resp: 17 16  Temp: 36.7 C 36.9 C  SpO2:      Last Pain:  Vitals:   03/17/18 1800  TempSrc: Oral  PainSc:    Pain Goal:                 Roey Coopman

## 2018-03-17 NOTE — Anesthesia Pain Management Evaluation Note (Signed)
  CRNA Pain Management Visit Note  Patient: [name] [name], 27 y.o., female  "Hello I am a member of the anesthesia team at [facility]. We have an anesthesia team available at all times to provide care throughout the hospital, including epidural management and anesthesia for C-section. I don't know your plan for the delivery whether it a natural birth, water birth, IV sedation, nitrous supplementation, doula or epidural, but we want to meet your pain goals."   1.Was your pain managed to your expectations on prior hospitalizations?   Yes   2.What is your expectation for pain management during this hospitalization?     Epidural  3.How can we help you reach that goal? Epidural   Record the patient's initial score and the patient's pain goal.   Pain: 2  Pain Goal: 6 [facility] wants you to be able to say your pain was always managed very well.  Rica Records 03/17/2018

## 2018-03-17 NOTE — Anesthesia Preprocedure Evaluation (Addendum)
Anesthesia Evaluation  Patient identified by MRN, date of birth, ID band Patient awake    Reviewed: Allergy & Precautions, Patient's Chart, lab work & pertinent test results  Airway Mallampati: I  TM Distance: >3 FB Neck ROM: Full    Dental no notable dental hx. (+) Teeth Intact   Pulmonary Current Smoker,    Pulmonary exam normal breath sounds clear to auscultation       Cardiovascular negative cardio ROS Normal cardiovascular exam Rhythm:Regular Rate:Normal     Neuro/Psych PSYCHIATRIC DISORDERS Anxiety Bipolar Disorder negative neurological ROS     GI/Hepatic Neg liver ROS, GERD  ,  Endo/Other  negative endocrine ROS  Renal/GU Hx/o pyelonephritis with previous pregnancy  negative genitourinary   Musculoskeletal negative musculoskeletal ROS (+)   Abdominal   Peds  Hematology  (+) anemia ,   Anesthesia Other Findings Pierced upper lip and nose  Reproductive/Obstetrics (+) Pregnancy                            Anesthesia Physical Anesthesia Plan  ASA: II  Anesthesia Plan: Epidural   Post-op Pain Management:    Induction:   PONV Risk Score and Plan:   Airway Management Planned: Natural Airway  Additional Equipment:   Intra-op Plan:   Post-operative Plan:   Informed Consent: I have reviewed the patients History and Physical, chart, labs and discussed the procedure including the risks, benefits and alternatives for the proposed anesthesia with the patient or authorized representative who has indicated his/her understanding and acceptance.     Plan Discussed with: Anesthesiologist  Anesthesia Plan Comments:         Anesthesia Quick Evaluation

## 2018-03-17 NOTE — Brief Op Note (Signed)
03/17/2018  9:48 PM  PATIENT:  [name] [name]  27 y.o. female  PRE-OPERATIVE DIAGNOSIS:  desires sterilization  POST-OPERATIVE DIAGNOSIS:  POST PARTUM TUBAL LIGATION  PROCEDURE:  Procedure(s): POST PARTUM TUBAL LIGATION (Bilateral)  SURGEON:  Surgeon(s) and Role:    * Bovard-Stuckert, Amarra Sawyer, MD - Primary  ANESTHESIA:   local and epidural  EBL:  4 mL IVF and uop per anesthesia  BLOOD ADMINISTERED:none  DRAINS: Urinary Catheter (Foley) to PACU  LOCAL MEDICATIONS USED:  MARCAINE     SPECIMEN:  Source of Specimen:  B tubal segments  DISPOSITION OF SPECIMEN:  PATHOLOGY  COUNTS:  YES  TOURNIQUET:  * No tourniquets in log *  DICTATION: .Other Dictation: Dictation Number [id number]  PLAN OF CARE: Admit to inpatient   PATIENT DISPOSITION:  PACU - hemodynamically stable.   Delay start of Pharmacological VTE agent (>24hrs) due to surgical blood loss or risk of bleeding: not applicable

## 2018-03-18 ENCOUNTER — Encounter (HOSPITAL_COMMUNITY): Payer: Self-pay | Admitting: Obstetrics and Gynecology

## 2018-03-18 LAB — CBC
HCT: 28.3 % — ABNORMAL LOW (ref 36.0–46.0)
Hemoglobin: 9.9 g/dL — ABNORMAL LOW (ref 12.0–15.0)
MCH: 32.8 pg (ref 26.0–34.0)
MCHC: 35 g/dL (ref 30.0–36.0)
MCV: 93.7 fL (ref 78.0–100.0)
Platelets: 225 10*3/uL (ref 150–400)
RBC: 3.02 MIL/uL — ABNORMAL LOW (ref 3.87–5.11)
RDW: 12 % (ref 11.5–15.5)
WBC: 12.2 10*3/uL — AB (ref 4.0–10.5)

## 2018-03-18 MED ORDER — IBUPROFEN 600 MG PO TABS: 600.0000 mg | ORAL_TABLET | Freq: Four times a day (QID) | ORAL | 1 refills | Status: DC

## 2018-03-18 MED ORDER — PRENATAL MULTIVITAMIN CH: 1.0000 | ORAL_TABLET | Freq: Every day | ORAL | 3 refills | Status: DC

## 2018-03-18 MED ORDER — OXYCODONE HCL 5 MG PO TABS: 5.0000 mg | ORAL_TABLET | Freq: Four times a day (QID) | ORAL | 0 refills | Status: DC | PRN

## 2018-03-18 NOTE — Discharge Summary (Signed)
OB Discharge Summary     Patient Name: [name] [name] DOB: 27-May-1991 MRN: [id number]  Date of admission: 03/17/2018 Delivering MD: Sherian Rein   Date of discharge: 03/18/2018  Admitting diagnosis: INDUCTION Intrauterine pregnancy: [redacted]w[redacted]d     Secondary diagnosis:  Principal Problem:   SVD (spontaneous vaginal delivery) Active Problems:   Normal pregnancy in multigravida in third trimester   S/P tubal ligation  Additional problems: N/A     Discharge diagnosis: Term Pregnancy Delivered and s/p PP BTL                                                                                                Post partum procedures:postpartum tubal ligation  Augmentation: AROM and Pitocin  Complications: None  Hospital course:  Induction of Labor With Vaginal Delivery   27 y.o. yo G2P2002 at [redacted]w[redacted]d was admitted to the hospital 03/17/2018 for induction of labor.  Indication for induction: Favorable cervix at term.  Patient had an uncomplicated labor course as follows: Membrane Rupture Time/Date: 9:35 AM ,03/17/2018   Intrapartum Procedures: Episiotomy: None [1]                                         Lacerations:  Labial [10]  Patient had delivery of a Viable infant.  Information for the patient's newborn:  Yakelin, Grenier [[id number]]  Delivery Method: Vaginal, Spontaneous(Filed from Delivery Summary)   03/17/2018  Details of delivery can be found in separate delivery note.  Patient had a routine postpartum course. Patient is discharged home 03/18/18.  Physical exam  Vitals:   03/17/18 2245 03/17/18 2327 03/18/18 0227 03/18/18 0655  BP: 112/79 111/68 128/90 123/84  Pulse: 75 74 73 78  Resp: 11 18 18 18   Temp:  97.8 F (36.6 C) 98.7 F (37.1 C) 98.1 F (36.7 C)  TempSrc:  Oral Oral Oral  SpO2: 99% 97% 99% 98%  Weight:      Height:       General: alert and no distress Lochia: appropriate Uterine Fundus: firm Incision: Healing well with no significant drainage DVT  Evaluation: No evidence of DVT seen on physical exam. Labs: Lab Results  Component Value Date   WBC 12.2 (H) 03/18/2018   HGB 9.9 (L) 03/18/2018   HCT 28.3 (L) 03/18/2018   MCV 93.7 03/18/2018   PLT 225 03/18/2018   CMP Latest Ref Rng & Units 05/31/2011  Glucose 70 - 99 mg/dL 782(N)  BUN 6 - 23 mg/dL 8  Creatinine 5.62 - 1.30 mg/dL 8.65  Sodium 784 - 696 mEq/L 135  Potassium 3.5 - 5.1 mEq/L 3.5  Chloride 96 - 112 mEq/L 101  CO2 19 - 32 mEq/L 22  Calcium 8.4 - 10.5 mg/dL 8.3(L)  Total Protein 6.0 - 8.3 g/dL 6.6  Total Bilirubin 0.3 - 1.2 mg/dL 0.4  Alkaline Phos 39 - 117 U/L 88  AST 0 - 37 U/L 16  ALT 0 - 35 U/L 14    Discharge instruction: per After Visit Summary  and "Baby and Me Booklet".  After visit meds:  Allergies as of 03/18/2018      Reactions   Amoxicillin-pot Clavulanate Anaphylaxis, Hives   Bactrim Hives   Penicillins Hives   Has patient had a PCN reaction causing immediate rash, facial/tongue/throat swelling, SOB or lightheadedness with hypotension: yes Has patient had a PCN reaction causing severe rash involving mucus membranes or skin necrosis: no Has patient had a PCN reaction that required hospitalization : yes, at MD office Has patient had a PCN reaction occurring within the last 10 years: yes If all of the above answers are "NO", then may proceed with Cephalosporin use.      Medication List    TAKE these medications   calcium carbonate 500 MG chewable tablet Commonly known as:  TUMS - dosed in mg elemental calcium Chew 2 tablets by mouth as needed for indigestion or heartburn.   cephALEXin 250 MG capsule Commonly known as:  KEFLEX Take 250 mg by mouth 4 (four) times daily. For ear infection has approximately 4 more days left of antibiotics   doxylamine (Sleep) 25 MG tablet Commonly known as:  UNISOM Take 25 mg by mouth at bedtime as needed for sleep.   ibuprofen 600 MG tablet Commonly known as:  ADVIL,MOTRIN Take 1 tablet (600 mg total) by  mouth every 6 (six) hours.   oxyCODONE 5 MG immediate release tablet Commonly known as:  Oxy IR/ROXICODONE Take 1 tablet (5 mg total) by mouth every 6 (six) hours as needed for severe pain (pain scale 4-7).   prenatal multivitamin Tabs tablet Take 1 tablet by mouth daily at 12 noon.   sertraline 25 MG tablet Commonly known as:  ZOLOFT Take 25 mg by mouth at bedtime.       Diet: routine diet  Activity: Advance as tolerated. Pelvic rest for 6 weeks.   Outpatient follow up:2 and 6 weeks Follow up Appt:No future appointments. Follow up Visit:No follow-ups on file.  Postpartum contraception: Tubal Ligation  Newborn Data: Live born female  Birth Weight: 7 lb 4 oz (3289 g) APGAR: 7, 8  Newborn Delivery   Birth date/time:  03/17/2018 16:33:00 Delivery type:  Vaginal, Spontaneous     Baby Feeding: Bottle and Breast Disposition:home with mother   03/18/2018 Sherian Rein, MD

## 2018-03-18 NOTE — Lactation Note (Signed)
This note was copied from a baby's chart. Lactation Consultation Note  Patient Name: [name] [name] [id number] Date: 03/18/2018   Baby 21 hours old and mother is breastfeeding and states she gave formula last night so she could sleep. Encouraged breastfeeding before offering formula to help establish her milk supply. Mother nipples evert and compressible.  No cracks or abrasions. Provided mother with manual pump. Mom encouraged to feed baby 8-12 times/24 hours and with feeding cues.  Reviewed engorgement care and monitoring voids/stools.      Maternal Data    Feeding Feeding Type: Breast Fed  LATCH Score Latch: Grasps breast easily, tongue down, lips flanged, rhythmical sucking.  Audible Swallowing: A few with stimulation  Type of Nipple: Everted at rest and after stimulation  Comfort (Breast/Nipple): Soft / non-tender  Hold (Positioning): No assistance needed to correctly position infant at breast.  LATCH Score: 9  Interventions    Lactation Tools Discussed/Used     Consult Status      Hardie Pulley 03/18/2018, 1:34 PM

## 2018-03-18 NOTE — Plan of Care (Signed)
Patient discharged to home

## 2018-03-18 NOTE — Progress Notes (Addendum)
Post Partum Day 1/POD #1 Subjective: no complaints, up ad lib, voiding, tolerating PO and nl lochia, pain controlled  Objective: Blood pressure 123/84, pulse 78, temperature 98.1 F (36.7 C), temperature source Oral, resp. rate 18, height 5\' 3"  (1.6 m), weight 67.5 kg, last menstrual period 06/14/2017, SpO2 98 %, unknown if currently breastfeeding.  Physical Exam:  General: alert and no distress Lochia: appropriate Uterine Fundus: firm Incision: healing well DVT Evaluation: No evidence of DVT seen on physical exam.  Recent Labs    03/17/18 0733 03/18/18 0614  HGB 10.7* 9.9*  HCT 31.0* 28.3*    Assessment/Plan: Plan for discharge tomorrow, Breastfeeding and Lactation consult.  Routine PP care.  Pt may desire early discharge.  Will d/c with Motrin, oxycodone an dONV,  F/u 2 and 6 weeks  LOS: 1 day   [name] [name] 03/18/2018, 7:03 AM

## 2018-03-18 NOTE — Anesthesia Postprocedure Evaluation (Signed)
Anesthesia Post Note  Patient: [name] [name]  Procedure(s) Performed: AN AD HOC LABOR EPIDURAL     Patient location during evaluation: Mother Baby Anesthesia Type: Epidural Level of consciousness: awake and alert Pain management: pain level controlled Vital Signs Assessment: post-procedure vital signs reviewed and stable Respiratory status: spontaneous breathing, nonlabored ventilation and respiratory function stable Cardiovascular status: stable Postop Assessment: no headache, no backache and epidural receding Anesthetic complications: no    Last Vitals:  Vitals:   03/18/18 0227 03/18/18 0655  BP: 128/90 123/84  Pulse: 73 78  Resp: 18 18  Temp: 37.1 C 36.7 C  SpO2: 99% 98%    Last Pain:  Vitals:   03/18/18 0655  TempSrc: Oral  PainSc: 6    Pain Goal:                 Junious Silk

## 2018-03-18 NOTE — Op Note (Signed)
[name], [name] [name] MEDICAL RECORD [id number] ACCOUNT [id number] DATE OF BIRTH:May 18, 1991 FACILITY: [facility] LOCATION: [id number] Marion Downer, MD  OPERATIVE REPORT  DATE OF PROCEDURE:  03/17/2018  PREOPERATIVE DIAGNOSIS:  Desires sterilization.  POSTOPERATIVE DIAGNOSIS:  Desires sterilization, status post postpartum bilateral tubal ligation.  PROCEDURE:  Postpartum bilateral tubal ligation.  SURGEON:  Sherian Rein, MD  ANESTHESIA:  Local and epidural.  ESTIMATED BLOOD LOSS:  Approximately 4 mL.  IV FLUID, AND URINE OUTPUT:  Per anesthesia.  COMPLICATIONS:  None.  PATHOLOGY:  Bilateral tubal segments.  FINDINGS:  Normal postpartum uterus and tubes.  DESCRIPTION OF PROCEDURE:  After informed consent was reviewed with the patient including risks, benefits and alternatives of the surgical procedure as well as permanence and risk of failure, she was transferred to the operating room after her epidural  had been redosed.  When the level had reached a point of adequacy, an approximately 2 cm infraumbilical incision was made in the skin in line with the folds of her umbilicus and carried through to the underlying layer of fascia.  The fascia was elevated  with Kocher clamps and both the anterior and posterior sheath were incised.  Peritoneum was entered bluntly.  The incision was extended.  She was airplaned to the left and her left tube was identified and followed out to the fimbriated end, elevated with  a Babcock and an intervening portion was doubly ligated and excised and sent to pathology.  Same was performed on the right.  With the patient still airplaned to the left, the right tube was identified, followed out to the fimbriated end, doubly ligated  and intervening portion was excised and sent to pathology.  This was noted to be hemostatic.  The fascia was reapproximated with 0 Vicryl.  A deep suture was placed for the dead space.  The skin was closed with  3-0 Vicryl and Dermabond was applied.  The  patient tolerated the procedure well.  Sponge, lap and needle count was correct x2.  TN/NUANCE  D:03/17/2018 T:03/18/2018 JOB:[id number]

## 2018-03-18 NOTE — Lactation Note (Signed)
This note was copied from a baby's chart. Lactation Consultation Note Baby 8 hrs old. Noted cyanosis around mouth and nose. Hands and feet acrocyanosis. Baby spitting foamy sputum. LC picked baby up, bending him over patting on back, used suction bulb to clear mouth. LC took temp. 98.6 axillary. RN in rm. Asked RN to check pulse ox. RN stated WDL. Mom BF her 1st child 2 months.  Mom has long soft breast w/everted nipples. Mom demonstrated hand expression w/colostrum noted.  Mom stated baby BF well. Mom had BTL, having abd. Cramps.  Newborn behavior, feeding habits, I&O, STS, supply and demand discussed. Mom encouraged to feed baby 8-12 times/24 hours and with feeding cues.  Noted baby spitting again. Asked mom if she has noted baby spitting up. Mom stated he may had spit once.  FOB laying on couch going to sleep. Mom stating she is rest. LC discussed importance of baby being monitored since is silently spitting. Asked mom if LC can take baby to CN to be monitored while mom sleeps d/t spitting. Mom stated OK. Baby appears to have bruising to nose and upper lip, but color is better and baby less ruddy when not spitting. Bracelets verified w/mom and baby. LC took baby to CN, gave report to RN. Before LC left nursery, baby spit up 2 more times.  Encouraged mom to call for baby when she wakes up. Baby will be taken to mom when cueing to feed. Call mom for questions or concerns about feeding. [facility] brochure given w/resources, support groups and LC services.  Patient Name: [name] [name] [id number] Date: 03/18/2018 Reason for consult: Initial assessment   Maternal Data Has patient been taught Hand Expression?: Yes Does the patient have breastfeeding experience prior to this delivery?: Yes  Feeding Feeding Type: Breast Fed  LATCH Score       Type of Nipple: Everted at rest and after stimulation  Comfort (Breast/Nipple): Soft / non-tender        Interventions Interventions: Breast  feeding basics reviewed;Breast massage;Hand express;Breast compression  Lactation Tools Discussed/Used WIC Program: No   Consult Status Consult Status: Follow-up Date: 03/19/18 Follow-up type: In-patient    Charyl Dancer 03/18/2018, 12:56 AM

## 2018-03-18 NOTE — Progress Notes (Signed)
CSW received consult for MOB due to history of anxiety and bipolar. CSW met with MOB and baby [name] [name] at bedside to complete discussion. 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 MOB reports that she is very aware of her triggers and has a great relationship with her OB Dr. Brevard. MOB reports being on Zoloft for the past two weeks, taking 25mg daily. MOB reports having an appointment with Dr. Brevard on Thursday, 10/10 for postpartum follow up. MOB reports she will have her Zoloft dose upped to 50mg during that appointment. MOB states she has great support from her FOB [name] [name], friends, and family. MOB is breastfeeding, MOB does not receive WIC but is knowledgeable on how to access resource. MOB was educated on baby blues period versus postpartum depression, MOB stated understanding. MOB states her FOB is very observant and will keep a watch on her. CSW encouraged MOB to reach out for assistance if needs arise.  Gwynne Kemnitz, MSW, LCSW-A Clinical Social Worker [facility] [phone]   

## 2018-03-19 NOTE — Anesthesia Postprocedure Evaluation (Signed)
Anesthesia Post Note  Patient: [name] [name]  Procedure(s) Performed: POST PARTUM TUBAL LIGATION (Bilateral )     Patient location during evaluation: PACU Anesthesia Type: Epidural Level of consciousness: awake and alert Pain management: pain level controlled Vital Signs Assessment: post-procedure vital signs reviewed and stable Respiratory status: spontaneous breathing, nonlabored ventilation, respiratory function stable and patient connected to nasal cannula oxygen Cardiovascular status: stable and blood pressure returned to baseline Postop Assessment: no apparent nausea or vomiting Anesthetic complications: no    Last Vitals:  Vitals:   03/18/18 1243 03/18/18 1701  BP: 110/69 100/61  Pulse: 69 71  Resp: 16 18  Temp: 36.7 C 36.6 C  SpO2:      Last Pain:  Vitals:   03/18/18 1701  TempSrc: Oral  PainSc:    Pain Goal:                 Trevor Iha

## 2018-03-24 ENCOUNTER — Encounter (HOSPITAL_COMMUNITY): Payer: Self-pay

## 2018-03-24 ENCOUNTER — Inpatient Hospital Stay (HOSPITAL_COMMUNITY): Payer: Medicaid Other

## 2018-03-24 ENCOUNTER — Inpatient Hospital Stay (HOSPITAL_COMMUNITY)
Admission: AD | Admit: 2018-03-24 | Discharge: 2018-03-24 | Disposition: A | Discharge status: Home / Self Care | Payer: Medicaid Other | Source: Ambulatory Visit | Attending: Obstetrics and Gynecology | Admitting: Obstetrics and Gynecology

## 2018-03-24 DIAGNOSIS — Z882 Allergy status to sulfonamides status: Secondary | ICD-10-CM | POA: Insufficient documentation

## 2018-03-24 DIAGNOSIS — G8918 Other acute postprocedural pain: Secondary | ICD-10-CM | POA: Diagnosis present

## 2018-03-24 DIAGNOSIS — Z880 Allergy status to penicillin: Secondary | ICD-10-CM | POA: Insufficient documentation

## 2018-03-24 DIAGNOSIS — O9089 Other complications of the puerperium, not elsewhere classified: Secondary | ICD-10-CM

## 2018-03-24 DIAGNOSIS — F17210 Nicotine dependence, cigarettes, uncomplicated: Secondary | ICD-10-CM | POA: Diagnosis not present

## 2018-03-24 DIAGNOSIS — R52 Pain, unspecified: Secondary | ICD-10-CM

## 2018-03-24 LAB — URINALYSIS, ROUTINE W REFLEX MICROSCOPIC
BILIRUBIN URINE: NEGATIVE
GLUCOSE, UA: NEGATIVE mg/dL
KETONES UR: NEGATIVE mg/dL
Nitrite: NEGATIVE
PROTEIN: NEGATIVE mg/dL
Specific Gravity, Urine: 1.02 (ref 1.005–1.030)
WBC, UA: >50 WBC/hpf — ABNORMAL HIGH (ref 0–5)
pH: 5 (ref 5.0–8.0)

## 2018-03-24 LAB — BASIC METABOLIC PANEL
Anion gap: 8 (ref 5–15)
BUN: 16 mg/dL (ref 6–20)
CALCIUM: 8.9 mg/dL (ref 8.9–10.3)
CHLORIDE: 107 mmol/L (ref 98–111)
CO2: 23 mmol/L (ref 22–32)
CREATININE: 0.59 mg/dL (ref 0.44–1.00)
GFR calc Af Amer: >60 mL/min (ref >60)
GFR calc non Af Amer: >60 mL/min (ref >60)
Glucose, Bld: 106 mg/dL — ABNORMAL HIGH (ref 70–99)
Potassium: 4 mmol/L (ref 3.5–5.1)
Sodium: 138 mmol/L (ref 135–145)

## 2018-03-24 LAB — CBC
HCT: 33.9 % — ABNORMAL LOW (ref 36.0–46.0)
HEMOGLOBIN: 11.6 g/dL — AB (ref 12.0–15.0)
MCH: 32.3 pg (ref 26.0–34.0)
MCHC: 34.2 g/dL (ref 30.0–36.0)
MCV: 94.4 fL (ref 80.0–100.0)
Platelets: 398 10*3/uL (ref 150–400)
RBC: 3.59 MIL/uL — ABNORMAL LOW (ref 3.87–5.11)
RDW: 12.2 % (ref 11.5–15.5)
WBC: 6.8 10*3/uL (ref 4.0–10.5)
nRBC: 0 % (ref 0.0–0.2)

## 2018-03-24 MED ORDER — OXYCODONE-ACETAMINOPHEN 5-325 MG PO TABS: 1.0000 | ORAL_TABLET | Freq: Four times a day (QID) | ORAL | 0 refills | Status: DC | PRN

## 2018-03-24 MED ORDER — IOPAMIDOL (ISOVUE-300) INJECTION 61%
100.0000 mL | Freq: Once | INTRAVENOUS | Status: AC | PRN
  Administered 2018-03-24: 100 mL via INTRAVENOUS

## 2018-03-24 NOTE — MAU Provider Note (Addendum)
History     CSN: [id number]  Arrival date and time: 03/24/18 1747   First Provider Initiated Contact with Patient 03/24/18 1854      Chief Complaint  Patient presents with  . Abdominal Pain  . Post-op Problem   [name] [name] is a 27 y.o G2P2 presenting to the MAU for dull, constant left sided pain 7 days after SVD and BTL. She states that the pain started 4 days ago and has progressively worsen, today it is a 7/10, and worse with motion, and worse on inspiration. She has not taken any pain meds for it, believes ibuprofen would not help the pain. Her last BM was last night, hard but does not believe this pain to be associated to her constipation. She is tolerating po, denies any dysuria, or increased urinary frequency.    Pt reports to CNM she has taken ibuprofen frequently since her surgery and it helped initially but not in the last 3-4 days.    Past Medical History:  Diagnosis Date  . Anxiety   . Mood disorder (HCC)   . MRSA (methicillin resistant Staphylococcus aureus) 2011  . Normal pregnancy, first 07/06/2011  . 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 Pyelonephritis complicating pregnancy   . S/P tubal ligation 03/17/2018  . SVD (spontaneous vaginal delivery) 07/07/2011  . SVD (spontaneous vaginal delivery) 03/17/2018  . Urinary tract infection    frequent    Past Surgical History:  Procedure Laterality Date  . CYSTOSCOPY     ? not sure, had blocked ureter at age 19wks  . TUBAL LIGATION Bilateral 03/17/2018   Procedure: POST PARTUM TUBAL LIGATION;  Surgeon: Sherian Rein, MD;  Location: [facility];  Service: Gynecology;  Laterality: Bilateral;    Family History  Problem Relation Age of Onset  . Depression Mother   . Alcohol abuse Father   . Mental illness Father        bipolar  . Cancer Paternal Aunt        cervix  . Depression Maternal Grandmother        depression  . Arthritis Maternal Grandmother   . Mental illness Paternal  Grandmother        bipolar  . Cancer Paternal Grandmother        cervix  . Anesthesia problems Neg Hx     Social History   Tobacco Use  . Smoking status: Current Every Day Smoker    Packs/day: 0.25    Years: 5.00    Pack years: 1.25    Types: Cigarettes    Last attempt to quit: 01/11/2011    Years since quitting: 7.2  . Smokeless tobacco: Never Used  Substance Use Topics  . Alcohol use: Yes    Alcohol/week: 28.0 standard drinks    Types: 14 Glasses of wine, 14 Standard drinks or equivalent per week    Comment: prior to preg  . Drug use: No    Comment: Had admissions in 2010 and 2011 for Drug use and overdose    Allergies:  Allergies  Allergen Reactions  . Amoxicillin-Pot Clavulanate Anaphylaxis and Hives  . Bactrim Hives  . Penicillins Hives    Has patient had a PCN reaction causing immediate rash, facial/tongue/throat swelling, SOB or lightheadedness with hypotension: yes Has patient had a PCN reaction causing severe rash involving mucus membranes or skin necrosis: no Has patient had a PCN reaction that required hospitalization : yes, at MD office Has patient had a PCN  reaction occurring within the last 10 years: yes If all of the above answers are "NO", then may proceed with Cephalosporin use.     Medications Prior to Admission  Medication Sig Dispense Refill Last Dose  . calcium carbonate (TUMS - DOSED IN MG ELEMENTAL CALCIUM) 500 MG chewable tablet Chew 2 tablets by mouth as needed for indigestion or heartburn.   03/17/2018 at Unknown time  . cephALEXin (KEFLEX) 250 MG capsule Take 250 mg by mouth 4 (four) times daily. For ear infection has approximately 4 more days left of antibiotics    03/17/2018 at Unknown time  . doxylamine, Sleep, (UNISOM) 25 MG tablet Take 25 mg by mouth at bedtime as needed for sleep.   03/16/2018 at Unknown time  . ibuprofen (ADVIL,MOTRIN) 600 MG tablet Take 1 tablet (600 mg total) by mouth every 6 (six) hours. 45 tablet 1   . oxyCODONE (OXY  IR/ROXICODONE) 5 MG immediate release tablet Take 1 tablet (5 mg total) by mouth every 6 (six) hours as needed for severe pain (pain scale 4-7). 10 tablet 0   . Prenatal Vit-Fe Fumarate-FA (PRENATAL MULTIVITAMIN) TABS tablet Take 1 tablet by mouth daily at 12 noon. 100 tablet 3   . sertraline (ZOLOFT) 25 MG tablet Take 25 mg by mouth at bedtime.   03/16/2018 at Unknown time    Review of Systems Physical Exam   Blood pressure 108/72, pulse 68, temperature 98.7 F (37.1 C), resp. rate 16, height 5\' 3"  (1.6 m), weight 59 kg, SpO2 100 %, unknown if currently breastfeeding.  Physical Exam  Constitutional: She appears well-developed and well-nourished.  HENT:  Head: Normocephalic and atraumatic.  Eyes: Pupils are equal, round, and reactive to light. EOM are normal.  Cardiovascular: Normal rate, regular rhythm and normal heart sounds.  Respiratory: Effort normal and breath sounds normal.  GI: Soft. Bowel sounds are normal. There is tenderness. There is guarding.  Significant LLQ tenderness    MAU Course  Procedures  MDM  7:57 PM  OB FELLOW MAU ATTESTATION  I have seen and examined this patient; I agree with above documentation in the studemt's note.   Care turned over to oncoming provider due to change of shift. Briefly, [name] [name] is 27 y.o. [phone] female PPD#7 from uncomplicated SVD and PP BTL. Presenting with 4 days of worsening LLQ abdominal pain. Denies constipation, vomiting, diarrhea and fevers. VSS at presentation. CBC without leukocytosis. BMET unremarkable. UA with large HgB, moderate leukocytes, >50 WBC, rate bacteria, 11-20 RBCs. Denies urinary symptoms. Urine sent for culture. Patient with significant LLQ tenderness even with distraction and guarding. Will obtain CT abdomen and pelvis.   Marcy Siren, D.O. OB Fellow  03/24/2018, 7:57 PM   MDM: Pt with constant left sided pain with guarding on my exam.  She is otherwise stable, well-appearing. S/O and infant in  room and pt is bonding well.  She reports hx of bipolar for which she has counseling and substance abuse but none in recent years. She took oxycodone as prescribed after her surgery 7 days ago but was only D/C'd with 10 tablets so has not taken any in the last 3 days.  Consult Dr Mindi Slicker with assessment and findings. D/c home with pain medication and follow up in the office early next week.  Rx for Percocet 5/325, take 1-2 Q 6 hours PRN x 20 tabs.      Assessment and Plan   1. Postoperative pain   2. Postpartum pain     D/C  home F/U in office  Sharen Counter, CNM 9:56 PM

## 2018-03-24 NOTE — MAU Note (Signed)
Pt reports she is s/p SVD on 10/04 and BTL same day. Reports pain in her left pelvic area that is constant

## 2018-03-24 NOTE — MAU Note (Signed)
Pt states she's had some constipation since delivery.

## 2018-03-26 LAB — URINE CULTURE

## 2018-06-20 ENCOUNTER — Inpatient Hospital Stay (HOSPITAL_COMMUNITY)
Admission: AD | Admit: 2018-06-20 | Discharge: 2018-06-20 | Disposition: A | Discharge status: Home / Self Care | Payer: Medicaid Other | Source: Ambulatory Visit | Attending: Obstetrics and Gynecology | Admitting: Obstetrics and Gynecology

## 2018-06-20 ENCOUNTER — Other Ambulatory Visit: Payer: Self-pay

## 2018-06-20 ENCOUNTER — Encounter (HOSPITAL_COMMUNITY): Payer: Self-pay

## 2018-06-20 DIAGNOSIS — Z880 Allergy status to penicillin: Secondary | ICD-10-CM | POA: Diagnosis not present

## 2018-06-20 DIAGNOSIS — Z87448 Personal history of other diseases of urinary system: Secondary | ICD-10-CM | POA: Diagnosis not present

## 2018-06-20 DIAGNOSIS — N12 Tubulo-interstitial nephritis, not specified as acute or chronic: Secondary | ICD-10-CM | POA: Insufficient documentation

## 2018-06-20 DIAGNOSIS — N390 Urinary tract infection, site not specified: Secondary | ICD-10-CM | POA: Diagnosis not present

## 2018-06-20 DIAGNOSIS — R109 Unspecified abdominal pain: Secondary | ICD-10-CM | POA: Diagnosis present

## 2018-06-20 DIAGNOSIS — F17210 Nicotine dependence, cigarettes, uncomplicated: Secondary | ICD-10-CM | POA: Insufficient documentation

## 2018-06-20 LAB — POCT PREGNANCY, URINE: PREG TEST UR: NEGATIVE

## 2018-06-20 LAB — CBC WITH DIFFERENTIAL/PLATELET
BASOS PCT: 0 %
Basophils Absolute: 0 10*3/uL (ref 0.0–0.1)
EOS ABS: 0.2 10*3/uL (ref 0.0–0.5)
Eosinophils Relative: 3 %
HCT: 39.6 % (ref 36.0–46.0)
HEMOGLOBIN: 12.7 g/dL (ref 12.0–15.0)
Lymphocytes Relative: 30 %
Lymphs Abs: 2.3 10*3/uL (ref 0.7–4.0)
MCH: 29.5 pg (ref 26.0–34.0)
MCHC: 32.1 g/dL (ref 30.0–36.0)
MCV: 91.9 fL (ref 80.0–100.0)
Monocytes Absolute: 0.4 10*3/uL (ref 0.1–1.0)
Monocytes Relative: 5 %
NEUTROS PCT: 62 %
NRBC: 0 % (ref 0.0–0.2)
Neutro Abs: 4.9 10*3/uL (ref 1.7–7.7)
Platelets: 261 10*3/uL (ref 150–400)
RBC: 4.31 MIL/uL (ref 3.87–5.11)
RDW: 13.3 % (ref 11.5–15.5)
WBC: 7.9 10*3/uL (ref 4.0–10.5)

## 2018-06-20 LAB — URINALYSIS, ROUTINE W REFLEX MICROSCOPIC
Bilirubin Urine: NEGATIVE
Glucose, UA: NEGATIVE mg/dL
Ketones, ur: NEGATIVE mg/dL
Nitrite: NEGATIVE
PROTEIN: 100 mg/dL — AB
RBC / HPF: >50 RBC/hpf — ABNORMAL HIGH (ref 0–5)
Specific Gravity, Urine: 1.013 (ref 1.005–1.030)
WBC, UA: >50 WBC/hpf — ABNORMAL HIGH (ref 0–5)
pH: 6 (ref 5.0–8.0)

## 2018-06-20 MED ORDER — FLUCONAZOLE 150 MG PO TABS: 150.0000 mg | ORAL_TABLET | ORAL | 1 refills | Status: DC | PRN

## 2018-06-20 MED ORDER — CIPROFLOXACIN HCL 500 MG PO TABS: 500.0000 mg | ORAL_TABLET | Freq: Two times a day (BID) | ORAL | 0 refills | Status: AC

## 2018-06-20 MED ORDER — PROMETHAZINE HCL 25 MG PO TABS: 25.0000 mg | ORAL_TABLET | Freq: Four times a day (QID) | ORAL | 1 refills | Status: DC | PRN

## 2018-06-20 MED ORDER — TRAMADOL HCL 50 MG PO TABS: 50.0000 mg | ORAL_TABLET | Freq: Four times a day (QID) | ORAL | 0 refills | Status: DC | PRN

## 2018-06-20 MED ORDER — TRAMADOL HCL 50 MG PO TABS
100.0000 mg | ORAL_TABLET | Freq: Once | ORAL | Status: AC
  Administered 2018-06-20: 100 mg via ORAL
  Filled 2018-06-20: qty 2

## 2018-06-20 MED ORDER — CIPROFLOXACIN HCL 500 MG PO TABS
500.0000 mg | ORAL_TABLET | Freq: Once | ORAL | Status: AC
  Administered 2018-06-20: 500 mg via ORAL
  Filled 2018-06-20: qty 1

## 2018-06-20 NOTE — Discharge Instructions (Signed)
Pyelonephritis, Adult    Pyelonephritis is a kidney infection. The kidneys are the organs that filter a person's blood and move waste out of the bloodstream and into the urine. Urine passes from the kidneys, through the ureters, and into the bladder. There are two main types of pyelonephritis:  · Infections that come on quickly without any warning (acute pyelonephritis).  · Infections that last for a long period of time (chronic pyelonephritis).  In most cases, the infection clears up with treatment and does not cause further problems. More severe infections or chronic infections can sometimes spread to the bloodstream or lead to other problems with the kidneys.  What are the causes?  This condition is usually caused by:  · Bacteria traveling from the bladder to the kidney through infected urine. The urine in the bladder can become infected with bacteria from:  ? Bladder infection (cystitis).  ? Inflammation of the prostate gland (prostatitis).  ? Sexual intercourse, in females.  · Bacteria traveling from the bloodstream to the kidney.  What increases the risk?  This condition is more likely to develop in:  · Pregnant women.  · Older people.  · People who have diabetes.  · People who have kidney stones or bladder stones.  · People who have other abnormalities of the kidney or ureter.  · People who have a catheter placed in the bladder.  · People who have cancer.  · People who are sexually active.  · Women who use spermicides.  · People who have had a prior urinary tract infection.  What are the signs or symptoms?  Symptoms of this condition include:  · Frequent urination.  · Strong or persistent urge to urinate.  · Burning or stinging when urinating.  · Abdominal pain.  · Back pain.  · Pain in the side or flank area.  · Fever.  · Chills.  · Blood in the urine, or dark urine.  · Nausea.  · Vomiting.  How is this diagnosed?  This condition may be diagnosed based on:  · Medical history and physical exam.  · Urine  tests.  · Blood tests.  You may also have imaging tests of the kidneys, such as an ultrasound or CT scan.  How is this treated?  Treatment for this condition may depend on the severity of the infection.  · If the infection is mild and is found early, you may be treated with antibiotic medicines taken by mouth. You will need to drink fluids to remain hydrated.  · If the infection is more severe, you may need to stay in the hospital and receive antibiotics given directly into a vein through an IV tube. You may also need to receive fluids through an IV tube if you are not able to remain hydrated. After your hospital stay, you may need to take oral antibiotics for a period of time.  Other treatments may be required, depending on the cause of the infection.  Follow these instructions at home:  Medicines  · Take over-the-counter and prescription medicines only as told by your health care provider.  · If you were prescribed an antibiotic medicine, take it as told by your health care provider. Do not stop taking the antibiotic even if you start to feel better.  General instructions  · Drink enough fluid to keep your urine clear or pale yellow.  · Avoid caffeine, tea, and carbonated beverages. They tend to irritate the bladder.  · Urinate often. Avoid holding in urine for   long periods of time.  · Urinate before and after sex.  · After a bowel movement, women should cleanse from front to back. Use each tissue only once.  · Keep all follow-up visits as told by your health care provider. This is important.  Contact a health care provider if:  · Your symptoms do not get better after 2 days of treatment.  · Your symptoms get worse.  · You have a fever.  Get help right away if:  · You are unable to take your antibiotics or fluids.  · You have shaking chills.  · You vomit.  · You have severe flank or back pain.  · You have extreme weakness or fainting.  This information is not intended to replace advice given to you by your health  care provider. Make sure you discuss any questions you have with your health care provider.  Document Released: 05/31/2005 Document Revised: 11/06/2015 Document Reviewed: 09/23/2014  Elsevier Interactive Patient Education © 2019 [employer]   

## 2018-06-20 NOTE — MAU Provider Note (Signed)
Chief Complaint:  Chief Complaint  Patient presents with  . Flank Pain     First Provider Initiated Contact with Patient 06/20/18 1735      HPI: [name] [name] is a 28 y.o. year old [id number] non-pregnant female who presents to MAU reporting dysuria,  Frequency since yesterday and flank pain, chills, nausea today. No improvement in pain w/ Tylenol. Hasn't checked temp.   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 Had surgery to correct it when she was a baby. Hx recurrent UTIs and Pyelo. Feels like when she gets pyelo.    Duration: 24 hours Associated Symptoms: Pos for chills, fatigue, vomiting x 1. Neg for abd pain, myalgias, diarrhea, constipation.   Review of Systems  Constitutional: Positive for chills and fatigue. Negative for appetite change and fever (hasn't checked).  Gastrointestinal: Positive for nausea and vomiting (x 1). Negative for abdominal pain, constipation and diarrhea.  Genitourinary: Positive for dysuria, flank pain and frequency. Negative for difficulty urinating, hematuria, pelvic pain and vaginal bleeding.  Musculoskeletal: Positive for back pain. Negative for myalgias.    OB History  Gravida Para Term Preterm AB Living  2 2 2     2   SAB TAB Ectopic Multiple Live Births        0 2    # Outcome Date GA Lbr Len/2nd Weight Sex Delivery Anes PTL Lv  2 Term 03/17/18 7563w3d 05:19 / 00:14 3289 g M Vag-Spont EPI  LIV  1 Term 07/07/11 9353w1d 05:01 / 00:24 3855 g M Vag-Spont EPI  LIV   Past Medical History:  Diagnosis Date  . Anxiety   . Mood disorder (HCC)   . MRSA (methicillin resistant Staphylococcus aureus) 2011  . Normal pregnancy, first 07/06/2011  . 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 Pyelonephritis complicating pregnancy   . S/P tubal ligation 03/17/2018  . SVD (spontaneous vaginal delivery) 07/07/2011  . SVD (spontaneous vaginal delivery) 03/17/2018  . Urinary tract infection    frequent   Allergies  Allergen Reactions  .  Amoxicillin-Pot Clavulanate Anaphylaxis and Hives  . Bactrim Hives  . Penicillins Hives    Has patient had a PCN reaction causing immediate rash, facial/tongue/throat swelling, SOB or lightheadedness with hypotension: yes Has patient had a PCN reaction causing severe rash involving mucus membranes or skin necrosis: no Has patient had a PCN reaction that required hospitalization : yes, at MD office Has patient had a PCN reaction occurring within the last 10 years: yes If all of the above answers are "NO", then may proceed with Cephalosporin use.    Social History   Socioeconomic History  . Marital status: Single    Spouse name: Danny  . Number of children: 2  . Years of education: Not on file  . Highest education level: Not on file  Occupational History  . Not on file  Social Needs  . Financial resource strain: Not on file  . Food insecurity:    Worry: Not on file    Inability: Not on file  . Transportation needs:    Medical: No    Non-medical: No  Tobacco Use  . Smoking status: Current Every Day Smoker    Packs/day: 0.25    Years: 5.00    Pack years: 1.25    Types: Cigarettes    Last attempt to quit: 01/11/2011    Years since quitting: 7.4  . Smokeless tobacco: Never Used  Substance and Sexual Activity  . Alcohol use: Yes  Alcohol/week: 28.0 standard drinks    Types: 14 Glasses of wine, 14 Standard drinks or equivalent per week    Comment: prior to preg  . Drug use: No    Comment: Had admissions in 2010 and 2011 for Drug use and overdose  . Sexual activity: Yes    Birth control/protection: Surgical  Lifestyle  . Physical activity:    Days per week: Not on file    Minutes per session: Not on file  . Stress: Not on file  Relationships  . Social connections:    Talks on phone: Not on file    Gets together: Not on file    Attends religious service: Not on file    Active member of club or organization: Not on file    Attends meetings of clubs or organizations: Not  on file    Relationship status: Not on file  . Intimate partner violence:    Fear of current or ex partner: Not on file    Emotionally abused: Not on file    Physically abused: Not on file    Forced sexual activity: Not on file  Other Topics Concern  . Not on file  Social History Narrative  . Not on file   Past Surgical History:  Procedure Laterality Date  . CYSTOSCOPY     ? not sure, had blocked ureter at age 68wks  . TUBAL LIGATION Bilateral 03/17/2018   Procedure: POST PARTUM TUBAL LIGATION;  Surgeon: Sherian Rein, MD;  Location: [facility];  Service: Gynecology;  Laterality: Bilateral;    Patient Vitals for the past 24 hrs:  BP Temp Temp src Pulse Resp SpO2 Weight  06/20/18 1958 114/69 - - 76 - - -  06/20/18 1639 111/71 99.2 F (37.3 C) Oral 73 12 97 % -  06/20/18 1633 - - - - - - 61.5 kg   Physical Exam Constitutional:      General: She is not in acute distress.    Appearance: Normal appearance. She is not ill-appearing, toxic-appearing or diaphoretic.  Eyes:     General: No scleral icterus. Cardiovascular:     Rate and Rhythm: Normal rate.  Pulmonary:     Effort: Pulmonary effort is normal.  Abdominal:     General: Abdomen is flat. There is no distension.     Tenderness: There is no abdominal tenderness. There is right CVA tenderness. There is no left CVA tenderness.  Neurological:     Mental Status: She is alert.     Labs Results for orders placed or performed during the hospital encounter of 06/20/18 (from the past 24 hour(s))  Urinalysis, Routine w reflex microscopic     Status: Abnormal   Collection Time: 06/20/18  4:39 PM  Result Value Ref Range   Color, Urine YELLOW YELLOW   APPearance CLOUDY (A) CLEAR   Specific Gravity, Urine 1.013 1.005 - 1.030   pH 6.0 5.0 - 8.0   Glucose, UA NEGATIVE NEGATIVE mg/dL   Hgb urine dipstick MODERATE (A) NEGATIVE   Bilirubin Urine NEGATIVE NEGATIVE   Ketones, ur NEGATIVE NEGATIVE mg/dL   Protein, ur  979 (A) NEGATIVE mg/dL   Nitrite NEGATIVE NEGATIVE   Leukocytes, UA LARGE (A) NEGATIVE   RBC / HPF >50 (H) 0 - 5 RBC/hpf   WBC, UA >50 (H) 0 - 5 WBC/hpf   Bacteria, UA RARE (A) NONE SEEN   Squamous Epithelial / LPF 6-10 0 - 5   WBC Clumps PRESENT    Non Squamous  Epithelial 0-5 (A) NONE SEEN  Pregnancy, urine POC     Status: None   Collection Time: 06/20/18  4:52 PM  Result Value Ref Range   Preg Test, Ur NEGATIVE NEGATIVE  CBC with Differential/Platelet     Status: None   Collection Time: 06/20/18  6:12 PM  Result Value Ref Range   WBC 7.9 4.0 - 10.5 K/uL   RBC 4.31 3.87 - 5.11 MIL/uL   Hemoglobin 12.7 12.0 - 15.0 g/dL   HCT 16.1 09.6 - 04.5 %   MCV 91.9 80.0 - 100.0 fL   MCH 29.5 26.0 - 34.0 pg   MCHC 32.1 30.0 - 36.0 g/dL   RDW 40.9 81.1 - 91.4 %   Platelets 261 150 - 400 K/uL   nRBC 0.0 0.0 - 0.2 %   Neutrophils Relative % 62 %   Neutro Abs 4.9 1.7 - 7.7 K/uL   Lymphocytes Relative 30 %   Lymphs Abs 2.3 0.7 - 4.0 K/uL   Monocytes Relative 5 %   Monocytes Absolute 0.4 0.1 - 1.0 K/uL   Eosinophils Relative 3 %   Eosinophils Absolute 0.2 0.0 - 0.5 K/uL   Basophils Relative 0 %   Basophils Absolute 0.0 0.0 - 0.1 K/uL    Imaging No results found.  MAU Course/MDM - Complicated UTI vs mild/early right pyelonephritis. Discussed Hx, labs, exam w/ Dr. Shawnie Pons. Agrees w/ POC. New orders: W/ absence of fever, leukocytosis of significant N/V pt is a candidate for OP ABX. Will Also Rx Phenergan, Ultram, Cipro x 14 days and Diflucan (in case of VVC). Needs to F/U at ED if Sx worsen. [facility] cannot admit non-pregnant pt's w/ Pyelo.   Assessment UTI/Pyelonephritis  Plan D/C home in stable condition. Push fluids Pyelonephritis precautions F/U w/ PCP as scheduled or sooner as needed if symptoms do to improve or worsen in 48 -72 hours. Recommend establishing care w/ urologist.  F/U in ED in emergencies Allergies as of 06/20/2018      Reactions   Amoxicillin-pot Clavulanate Anaphylaxis,  Hives   Bactrim Hives   Penicillins Hives   Has patient had a PCN reaction causing immediate rash, facial/tongue/throat swelling, SOB or lightheadedness with hypotension: yes Has patient had a PCN reaction causing severe rash involving mucus membranes or skin necrosis: no Has patient had a PCN reaction that required hospitalization : yes, at MD office Has patient had a PCN reaction occurring within the last 10 years: yes If all of the above answers are "NO", then may proceed with Cephalosporin use.      Medication List    STOP taking these medications   cephALEXin 250 MG capsule Commonly known as:  KEFLEX   oxyCODONE-acetaminophen 5-325 MG tablet Commonly known as:  PERCOCET/ROXICET     TAKE these medications   calcium carbonate 500 MG chewable tablet Commonly known as:  TUMS - dosed in mg elemental calcium Chew 2 tablets by mouth as needed for indigestion or heartburn.   ciprofloxacin 500 MG tablet Commonly known as:  CIPRO Take 1 tablet (500 mg total) by mouth 2 (two) times daily for 14 days.   doxylamine (Sleep) 25 MG tablet Commonly known as:  UNISOM Take 25 mg by mouth at bedtime as needed for sleep.   fluconazole 150 MG tablet Commonly known as:  DIFLUCAN Take 1 tablet (150 mg total) by mouth every three (3) days as needed (Yeast infection). For three doses   ibuprofen 600 MG tablet Commonly known as:  ADVIL,MOTRIN Take 1  tablet (600 mg total) by mouth every 6 (six) hours.   prenatal multivitamin Tabs tablet Take 1 tablet by mouth daily at 12 noon.   promethazine 25 MG tablet Commonly known as:  PHENERGAN Take 1 tablet (25 mg total) by mouth every 6 (six) hours as needed for nausea or vomiting.   sertraline 25 MG tablet Commonly known as:  ZOLOFT Take 25 mg by mouth at bedtime.   traMADol 50 MG tablet Commonly known as:  ULTRAM Take 1-2 tablets (50-100 mg total) by mouth every 6 (six) hours as needed for severe pain.        Katrinka BlazingSmith, [place],  [place] 06/20/2018 7:54 PM  3

## 2018-06-20 NOTE — MAU Note (Signed)
Pt states that she thinks she has a kidney infection and a uti.   Hx of kidney infection.

## 2018-06-23 LAB — URINE CULTURE: Special Requests: NORMAL

## 2018-06-26 ENCOUNTER — Encounter: Payer: Self-pay | Admitting: Advanced Practice Midwife

## 2018-06-26 DIAGNOSIS — B9629 Other Escherichia coli [E. coli] as the cause of diseases classified elsewhere: Secondary | ICD-10-CM | POA: Insufficient documentation

## 2018-06-26 DIAGNOSIS — Z1612 Extended spectrum beta lactamase (ESBL) resistance: Secondary | ICD-10-CM

## 2018-06-26 DIAGNOSIS — N390 Urinary tract infection, site not specified: Secondary | ICD-10-CM

## 2019-11-21 ENCOUNTER — Encounter (HOSPITAL_COMMUNITY): Payer: Self-pay | Admitting: Behavioral Health

## 2019-11-21 ENCOUNTER — Observation Stay (HOSPITAL_COMMUNITY)
Admission: RE | Admit: 2019-11-21 | Discharge: 2019-11-21 | Disposition: A | Discharge status: Home / Self Care | Payer: Medicaid Other | Attending: Psychiatry | Admitting: Psychiatry

## 2019-11-21 ENCOUNTER — Other Ambulatory Visit: Payer: Self-pay

## 2019-11-21 DIAGNOSIS — Z79899 Other long term (current) drug therapy: Secondary | ICD-10-CM | POA: Insufficient documentation

## 2019-11-21 DIAGNOSIS — Z818 Family history of other mental and behavioral disorders: Secondary | ICD-10-CM | POA: Diagnosis not present

## 2019-11-21 DIAGNOSIS — Z8261 Family history of arthritis: Secondary | ICD-10-CM | POA: Insufficient documentation

## 2019-11-21 DIAGNOSIS — Z811 Family history of alcohol abuse and dependence: Secondary | ICD-10-CM | POA: Diagnosis not present

## 2019-11-21 DIAGNOSIS — F39 Unspecified mood [affective] disorder: Secondary | ICD-10-CM | POA: Diagnosis not present

## 2019-11-21 DIAGNOSIS — Z8614 Personal history of Methicillin resistant Staphylococcus aureus infection: Secondary | ICD-10-CM | POA: Insufficient documentation

## 2019-11-21 DIAGNOSIS — Z881 Allergy status to other antibiotic agents status: Secondary | ICD-10-CM | POA: Insufficient documentation

## 2019-11-21 DIAGNOSIS — Z20822 Contact with and (suspected) exposure to covid-19: Secondary | ICD-10-CM | POA: Diagnosis not present

## 2019-11-21 DIAGNOSIS — Z880 Allergy status to penicillin: Secondary | ICD-10-CM | POA: Insufficient documentation

## 2019-11-21 DIAGNOSIS — F17210 Nicotine dependence, cigarettes, uncomplicated: Secondary | ICD-10-CM | POA: Insufficient documentation

## 2019-11-21 DIAGNOSIS — F1010 Alcohol abuse, uncomplicated: Secondary | ICD-10-CM | POA: Diagnosis not present

## 2019-11-21 DIAGNOSIS — R45851 Suicidal ideations: Secondary | ICD-10-CM | POA: Insufficient documentation

## 2019-11-21 DIAGNOSIS — Z8049 Family history of malignant neoplasm of other genital organs: Secondary | ICD-10-CM | POA: Insufficient documentation

## 2019-11-21 DIAGNOSIS — F319 Bipolar disorder, unspecified: Secondary | ICD-10-CM | POA: Diagnosis present

## 2019-11-21 DIAGNOSIS — Z888 Allergy status to other drugs, medicaments and biological substances status: Secondary | ICD-10-CM | POA: Diagnosis not present

## 2019-11-21 DIAGNOSIS — Z87440 Personal history of urinary (tract) infections: Secondary | ICD-10-CM | POA: Diagnosis not present

## 2019-11-21 DIAGNOSIS — Z791 Long term (current) use of non-steroidal anti-inflammatories (NSAID): Secondary | ICD-10-CM | POA: Insufficient documentation

## 2019-11-21 DIAGNOSIS — G4700 Insomnia, unspecified: Secondary | ICD-10-CM | POA: Diagnosis not present

## 2019-11-21 DIAGNOSIS — F419 Anxiety disorder, unspecified: Secondary | ICD-10-CM | POA: Insufficient documentation

## 2019-11-21 LAB — LIPID PANEL
Cholesterol: 166 mg/dL (ref 0–200)
HDL: 51 mg/dL (ref >40)
LDL Cholesterol: 95 mg/dL (ref 0–99)
Total CHOL/HDL Ratio: 3.3 RATIO
Triglycerides: 98 mg/dL (ref <150)
VLDL: 20 mg/dL (ref 0–40)

## 2019-11-21 LAB — URINALYSIS, COMPLETE (UACMP) WITH MICROSCOPIC
Bilirubin Urine: NEGATIVE
Glucose, UA: NEGATIVE mg/dL
Ketones, ur: NEGATIVE mg/dL
Leukocytes,Ua: NEGATIVE
Nitrite: NEGATIVE
Protein, ur: NEGATIVE mg/dL
Specific Gravity, Urine: 1.011 (ref 1.005–1.030)
pH: 5 (ref 5.0–8.0)

## 2019-11-21 LAB — COMPREHENSIVE METABOLIC PANEL
ALT: 18 U/L (ref 0–44)
AST: 23 U/L (ref 15–41)
Albumin: 4.4 g/dL (ref 3.5–5.0)
Alkaline Phosphatase: 36 U/L — ABNORMAL LOW (ref 38–126)
Anion gap: 10 (ref 5–15)
BUN: 8 mg/dL (ref 6–20)
CO2: 26 mmol/L (ref 22–32)
Calcium: 8.7 mg/dL — ABNORMAL LOW (ref 8.9–10.3)
Chloride: 106 mmol/L (ref 98–111)
Creatinine, Ser: 0.6 mg/dL (ref 0.44–1.00)
GFR calc Af Amer: >60 mL/min (ref >60)
GFR calc non Af Amer: >60 mL/min (ref >60)
Glucose, Bld: 99 mg/dL (ref 70–99)
Potassium: 4.1 mmol/L (ref 3.5–5.1)
Sodium: 142 mmol/L (ref 135–145)
Total Bilirubin: 0.4 mg/dL (ref 0.3–1.2)
Total Protein: 7.4 g/dL (ref 6.5–8.1)

## 2019-11-21 LAB — HEPATIC FUNCTION PANEL
ALT: 18 U/L (ref 0–44)
AST: 24 U/L (ref 15–41)
Albumin: 4.3 g/dL (ref 3.5–5.0)
Alkaline Phosphatase: 35 U/L — ABNORMAL LOW (ref 38–126)
Bilirubin, Direct: 0.1 mg/dL (ref 0.0–0.2)
Indirect Bilirubin: 0.3 mg/dL (ref 0.3–0.9)
Total Bilirubin: 0.4 mg/dL (ref 0.3–1.2)
Total Protein: 7.3 g/dL (ref 6.5–8.1)

## 2019-11-21 LAB — RAPID URINE DRUG SCREEN, HOSP PERFORMED
Amphetamines: NOT DETECTED
Barbiturates: NOT DETECTED
Benzodiazepines: NOT DETECTED
Cocaine: NOT DETECTED
Opiates: NOT DETECTED
Tetrahydrocannabinol: POSITIVE — AB

## 2019-11-21 LAB — HEMOGLOBIN A1C
Hgb A1c MFr Bld: 4.7 % — ABNORMAL LOW (ref 4.8–5.6)
Mean Plasma Glucose: 88.19 mg/dL

## 2019-11-21 LAB — SARS CORONAVIRUS 2 BY RT PCR (HOSPITAL ORDER, PERFORMED IN ~~LOC~~ HOSPITAL LAB): SARS Coronavirus 2: NEGATIVE

## 2019-11-21 LAB — MAGNESIUM: Magnesium: 2 mg/dL (ref 1.7–2.4)

## 2019-11-21 LAB — ETHANOL: Alcohol, Ethyl (B): 106 mg/dL — ABNORMAL HIGH (ref <10)

## 2019-11-21 LAB — TSH: TSH: 1.074 u[IU]/mL (ref 0.350–4.500)

## 2019-11-21 MED ORDER — MAGNESIUM HYDROXIDE 400 MG/5ML PO SUSP: 30.0000 mL | Freq: Every day | ORAL | Status: DC | PRN

## 2019-11-21 MED ORDER — ACETAMINOPHEN 325 MG PO TABS: 650.0000 mg | ORAL_TABLET | Freq: Four times a day (QID) | ORAL | Status: DC | PRN

## 2019-11-21 MED ORDER — ALUM & MAG HYDROXIDE-SIMETH 200-200-20 MG/5ML PO SUSP: 30.0000 mL | ORAL | Status: DC | PRN

## 2019-11-21 NOTE — [facility] Assessment (Signed)
[facility] Assessment Progress Note  Per Shuvon Rankin, FNP, this pt would benefit from psychiatric hospitalization at this time, but does not meet criteria for IVC.  Pt has been offered a bed which she has declined.  Pt is to be discharged from the [facility] with recommendation to follow up with her current outpatient provider.  This has been included in pt's discharge instructions.  Pt's nurse, Ethelene Browns, has been notified.  Doylene Canning, MA Triage Specialist [phone]
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
## 2019-11-21 NOTE — Discharge Instructions (Signed)
Keep scheduled appointment with current psychiatric provider.

## 2019-11-21 NOTE — Progress Notes (Signed)
Patient ID: [name] [name], female   DOB: 1990/06/30, 29 y.o.   MRN: [id number]  D: Pt alert and oriented on the unit.   A: Education, support, and encouragement provided. Discharge summary, medications and follow up appointments reviewed with pt. Suicide prevention resources provided. Pt's belongings in locker returned and belongings sheet signed.  R: Pt denies SI/HI, A/VH, pain, or any concerns at this time. Pt ambulatory on and off unit. Pt discharged to lobby.

## 2019-11-21 NOTE — Plan of Care (Signed)
[facility] Observation Crisis Plan  Reason for Crisis Plan:  Crisis Stabilization   Plan of Care:  Referral for Inpatient Hospitalization  Family Support:      Current Living Environment:  Living Arrangements: Spouse/significant other  Insurance:   Hospital Account    Name Acct ID Class Status Primary Coverage   [name], [name] [id number] BEHAVIORAL HEALTH OBSERVATION Open [place]        Guarantor Account (for Hospital Account [id number])    Name Relation to Pt Service Area Active? Acct Type   [name] [name] Self [facility] Yes Behavioral Health   Address Phone       [address] [place], [place] [address] [phone])          Coverage Information (for Hospital Account [id number])    F/O Payor/Plan Precert #   [facility] [place]    Subscriber Subscriber #   [name], [name] [id number]   Address Phone   [address] [place], [place] [address] [phone]      Legal Guardian:  Legal Guardian: Other:(self)  Primary Care Provider:  Patient, No Pcp Per  Current Outpatient Providers:  [facility]  Psychiatrist:  Name of Psychiatrist: [facility]  Counselor/Therapist:  Name of Therapist: none  Compliant with Medications:  No  Additional Information:   [name] [name] 6/9/20217:14 AM
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
## 2019-11-21 NOTE — Progress Notes (Signed)
Patient ID: [name] [name], female   DOB: January 04, 1991, 29 y.o.   MRN: [id number]  RN spoke with pt's significant other Alfredo [phone]). He stated that pt is safe to be discharged. "I think she's sobered up. She's safe here." NP notified.

## 2019-11-21 NOTE — BH Assessment (Signed)
[facility] Assessment Progress Note  Per Renaye Rakers, NP, this voluntary pt requires psychiatric hospitalization at this time.  Percell Boston, RN has assigned pt to [facility] Rm [id number].  Pt's nurse, Ethelene Browns, has been notified.  Doylene Canning, [place] [facility] Coordinator [phone]
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
## 2019-11-21 NOTE — Discharge Summary (Signed)
[facility] Psych Observation Discharge  11/21/2019 10:06 AM [name] [name]  MRN:  [id number] Principal Problem: Bipolar 1 disorder [facility]) Discharge Diagnoses: Principal Problem:   Bipolar 1 disorder (HCC)   Subjective:  "I have an important interview this morning at 10:00 AM and I really don't want to miss it.  Trying to get a job."   [name] [name], 29 y.o., female patient seen via tele psych by this provider, Dr. Lucianne Muss; and chart reviewed on 11/21/19.  On evaluation [name] [name] reports she was drinking yesterday celebrating with her husband and alcohol is a really bad trigger for her.  States that when she came to the hospital she was intoxicated.  States that she has outpatient psychiatric services and is compliant with her medications. Gave permission to speak to her mother or husband for collateral information. Patient is crying because she feels she is going to miss the chance for job interview.  Patient RN will call for collateral.   During evaluation [name] [name] is alert/oriented x 4; calm/cooperative; and mood is congruent with affect.  She does not appear to be responding to internal/external stimuli or delusional thoughts.  Patient denies suicidal/self-harm/homicidal ideation, psychosis, and paranoia.  Patient answered question appropriately.     Total Time spent with patient: 30 minutes  Past Psychiatric History: MDD, alcohol use disorder.  Patient states that she is doing better with alcohol and "I've actually decreased my alcohol intake"  Past Medical History:  Past Medical History:  Diagnosis Date  . Anxiety   . Mood disorder (HCC)   . MRSA (methicillin resistant Staphylococcus aureus) 2011  . Normal pregnancy, first 07/06/2011  . 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 Pyelonephritis complicating pregnancy   . S/P tubal ligation 03/17/2018  . SVD (spontaneous vaginal delivery) 07/07/2011  . SVD (spontaneous vaginal delivery) 03/17/2018  . Urinary  tract infection    frequent    Past Surgical History:  Procedure Laterality Date  . CYSTOSCOPY     ? not sure, had blocked ureter at age 2wks  . TUBAL LIGATION Bilateral 03/17/2018   Procedure: POST PARTUM TUBAL LIGATION;  Surgeon: Sherian Rein, MD;  Location: [facility];  Service: Gynecology;  Laterality: Bilateral;   Family History:  Family History  Problem Relation Age of Onset  . Depression Mother   . Alcohol abuse Father   . Mental illness Father        bipolar  . Cancer Paternal Aunt        cervix  . Depression Maternal Grandmother        depression  . Arthritis Maternal Grandmother   . Mental illness Paternal Grandmother        bipolar  . Cancer Paternal Grandmother        cervix  . Anesthesia problems Neg Hx    Family Psychiatric  History: See above Social History:  Social History   Substance and Sexual Activity  Alcohol Use Yes  . Alcohol/week: 28.0 standard drinks  . Types: 14 Glasses of wine, 14 Standard drinks or equivalent per week   Comment: prior to preg     Social History   Substance and Sexual Activity  Drug Use No   Comment: Had admissions in 2010 and 2011 for Drug use and overdose    Social History   Socioeconomic History  . Marital status: Single    Spouse name: Danny  . Number of children: 1  . Years of education:  Not on file  . Highest education level: Not on file  Occupational History  . Not on file  Tobacco Use  . Smoking status: Current Every Day Smoker    Packs/day: 0.25    Years: 5.00    Pack years: 1.25    Types: Cigarettes    Last attempt to quit: 01/11/2011    Years since quitting: 8.8  . Smokeless tobacco: Never Used  Substance and Sexual Activity  . Alcohol use: Yes    Alcohol/week: 28.0 standard drinks    Types: 14 Glasses of wine, 14 Standard drinks or equivalent per week    Comment: prior to preg  . Drug use: No    Comment: Had admissions in 2010 and 2011 for Drug use and overdose  . Sexual  activity: Yes    Birth control/protection: Surgical  Other Topics Concern  . Not on file  Social History Narrative  . Not on file   Social Determinants of Health   Financial Resource Strain:   . Difficulty of Paying Living Expenses:   Food Insecurity:   . Worried About Charity fundraiser in the Last Year:   . Arboriculturist in the Last Year:   Transportation Needs:   . Film/video editor (Medical):   Marland Kitchen Lack of Transportation (Non-Medical):   Physical Activity:   . Days of Exercise per Week:   . Minutes of Exercise per Session:   Stress:   . Feeling of Stress :   Social Connections:   . Frequency of Communication with Friends and Family:   . Frequency of Social Gatherings with Friends and Family:   . Attends Religious Services:   . Active Member of Clubs or Organizations:   . Attends Archivist Meetings:   Marland Kitchen Marital Status:     Has this patient used any form of tobacco in the last 30 days? (Cigarettes, Smokeless Tobacco, Cigars, and/or Pipes) A prescription for an FDA-approved tobacco cessation medication was offered at discharge and the patient refused  Current Medications: Current Facility-Administered Medications  Medication Dose Route Frequency Provider Last Rate Last Admin  . acetaminophen (TYLENOL) tablet 650 mg  650 mg Oral Q6H PRN Anike, Adaku C, NP      . alum & mag hydroxide-simeth (MAALOX/MYLANTA) 200-200-20 MG/5ML suspension 30 mL  30 mL Oral Q4H PRN Anike, Adaku C, NP      . magnesium hydroxide (MILK OF MAGNESIA) suspension 30 mL  30 mL Oral Daily PRN Anike, Adaku C, NP       PTA Medications: Medications Prior to Admission  Medication Sig Dispense Refill Last Dose  . sertraline (ZOLOFT) 100 MG tablet Take 1 tablet by mouth daily.     . ondansetron (ZOFRAN-ODT) 4 MG disintegrating tablet Take 4 mg by mouth every 6 (six) hours as needed.     . promethazine (PHENERGAN) 25 MG tablet Take 1 tablet (25 mg total) by mouth every 6 (six) hours as needed  for nausea or vomiting. 30 tablet 1 Unknown at Unknown time    Musculoskeletal: Strength & Muscle Tone: within normal limits Gait & Station: normal Patient leans: N/A  Psychiatric Specialty Exam: Physical Exam  Vitals reviewed. Constitutional: She is oriented to person, place, and time. She appears well-developed and well-nourished. No distress.  Respiratory: Effort normal.  Musculoskeletal:        General: Normal range of motion.     Cervical back: Normal range of motion.  Neurological: She is alert and oriented to  person, place, and time.  Skin: Skin is warm and dry.  Psychiatric: She has a normal mood and affect. Her speech is normal and behavior is normal. Thought content normal. Cognition and memory are normal. She expresses impulsivity.    Review of Systems  Psychiatric/Behavioral: Depression: Stable. Hallucinations: Denies. Memory loss: Denies. Substance abuse: Alcohol. Suicidal ideas: Denies. Nervous/anxious: Stable. Insomnia: Denies.   All other systems reviewed and are negative.   Blood pressure 106/76, pulse (!) 107, temperature 98 F (36.7 C), temperature source Oral, resp. rate 16, SpO2 98 %, unknown if currently breastfeeding.There is no height or weight on file to calculate BMI.  General Appearance: Casual  Eye Contact:  Good  Speech:  Blocked and Normal Rate  Volume:  Normal  Mood:  "Good, better"   Affect:  Appropriate and Congruent  Thought Process:  Coherent, Goal Directed and Descriptions of Associations: Intact  Orientation:  Full (Time, Place, and Person)  Thought Content:  WDL  Suicidal Thoughts:  No  Homicidal Thoughts:  No  Memory:  Immediate;   Good Recent;   Good  Judgement:  Intact  Insight:  Present  Psychomotor Activity:  Normal  Concentration:  Concentration: Good and Attention Span: Good  Recall:  Good  Fund of Knowledge:  Good  Language:  Good  Akathisia:  No  Handed:  Right  AIMS (if indicated):     Assets:  Communication  Skills Desire for Improvement Housing Physical Health Social Support Transportation  ADL's:  Intact  Cognition:  WNL  Sleep:        Demographic Factors:  Caucasian  Loss Factors: None  Historical Factors: Impulsivity  Risk Reduction Factors:   Responsible for children under 18 years of age, Sense of responsibility to family, Religious beliefs about death, Living with another person, especially a relative, Positive social support and Positive therapeutic relationship  Continued Clinical Symptoms:  Alcohol/Substance Abuse/Dependencies Previous Psychiatric Diagnoses and Treatments  Cognitive Features That Contribute To Risk:  None    Suicide Risk:  Minimal: No identifiable suicidal ideation.  Patients presenting with no risk factors but with morbid ruminations; may be classified as minimal risk based on the severity of the depressive symptoms    Plan Of Care/Follow-up recommendations:  Activity:  As tolerated Diet:  Heart healthy    Discharge Instructions     Keep scheduled appointment with current psychiatric provider    Disposition: No evidence of imminent risk to self or others at present.   Patient does not meet criteria for psychiatric inpatient admission. Supportive therapy provided about ongoing stressors. Discussed crisis plan, support from social network, calling 911, coming to the Emergency Department, and calling Suicide Hotline.   Crystallynn Noorani, NP 11/21/2019, 10:06 AM

## 2020-05-31 IMAGING — CT CT ABD-PELV W/ CM
1 of 3 series · 13 of 32 positions shown, 18 images · IV contrast (OMNIPAQUE)
Comparison: None.

CLINICAL DATA: LEFT lower quadrant pain and constipation status
post recent spontaneous vaginal delivery and tubal ligation.

EXAM:
CT ABDOMEN AND PELVIS WITH CONTRAST
TECHNIQUE: Multidetector CT imaging of the abdomen and pelvis was performed
using the standard protocol following bolus administration of
intravenous contrast.
CONTRAST:  100mL 504ILT-IFF IOPAMIDOL (504ILT-IFF) INJECTION 61%

[Series 2: routine abdomen/pelvis with · axial · 0.82mm/px · z∈[+461,+861]mm · 13 of 92 slices shown, 18 images]
[im 6/92  soft-tissue]
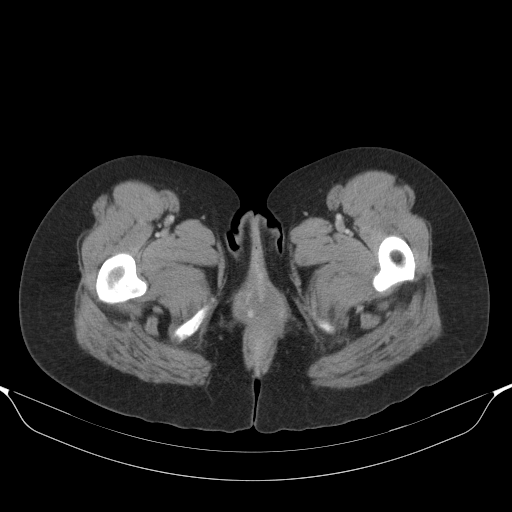
[im 6/92  bone]
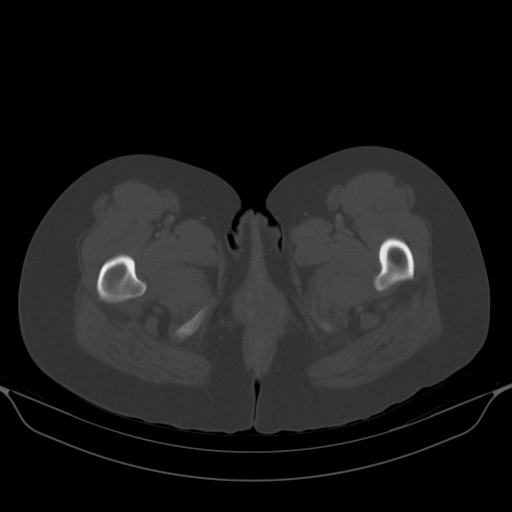
[im 16/92  soft-tissue]
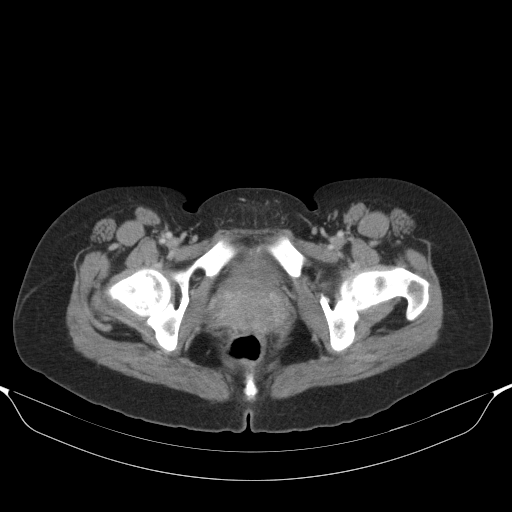
[im 21/92  soft-tissue]
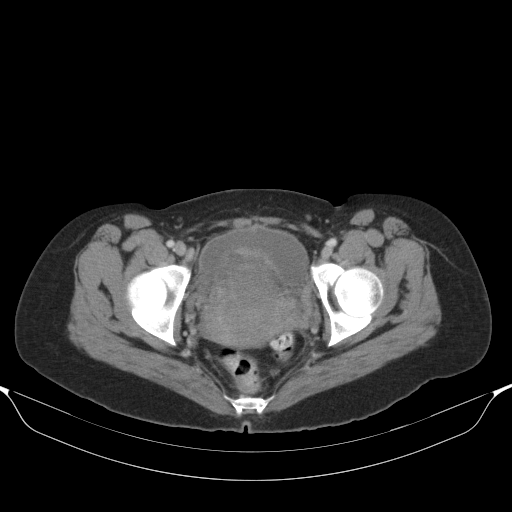
[im 26/92  soft-tissue]
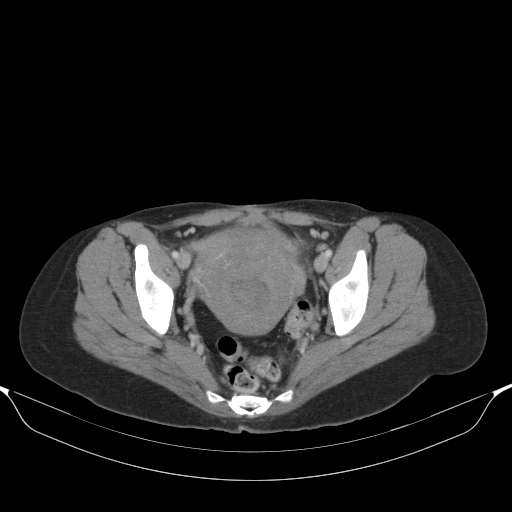
[im 36/92  soft-tissue]
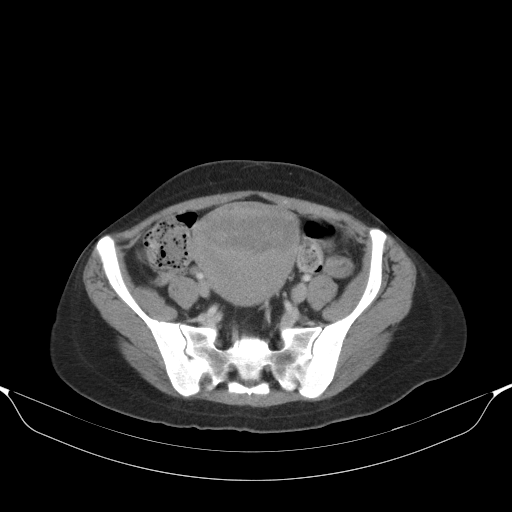
[im 41/92  soft-tissue]
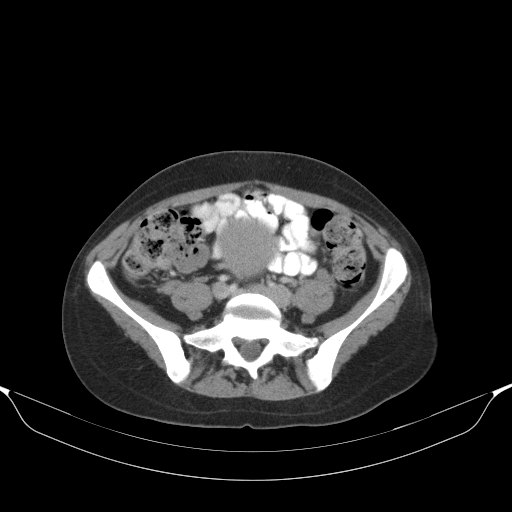
[im 51/92  soft-tissue]
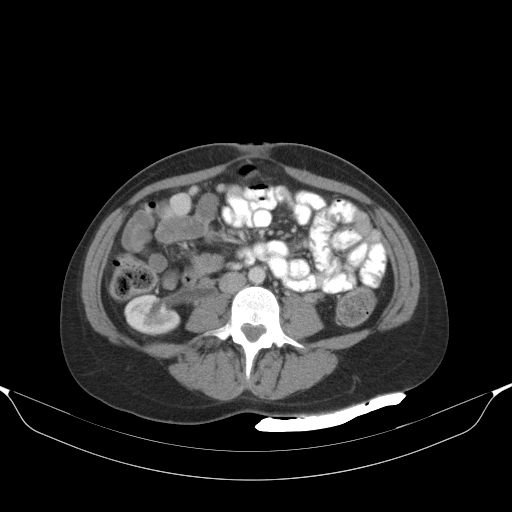
[im 56/92  soft-tissue]
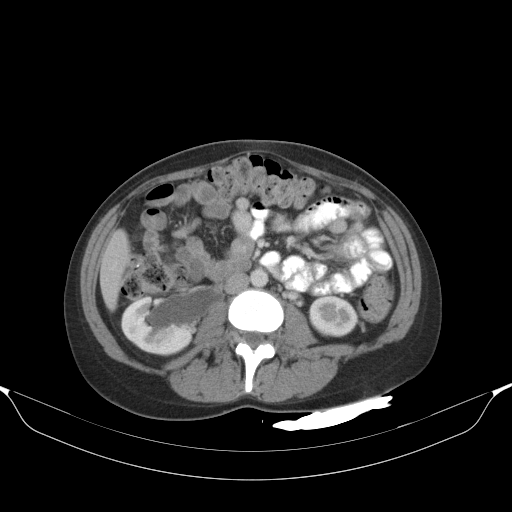
[im 66/92  soft-tissue]
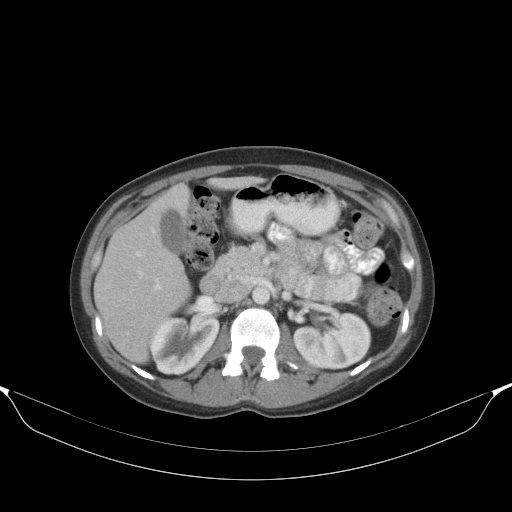
[im 66/92  bone]
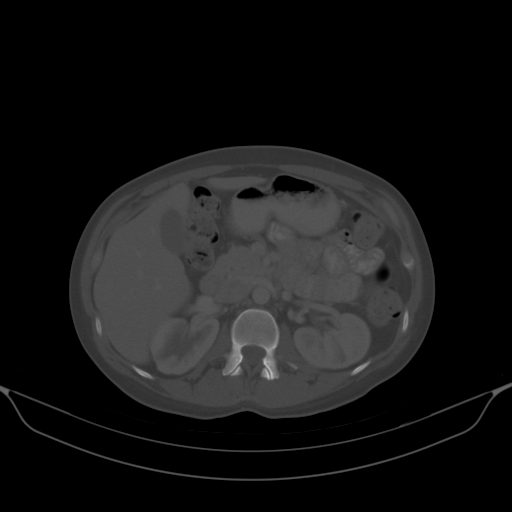
[im 71/92  soft-tissue]
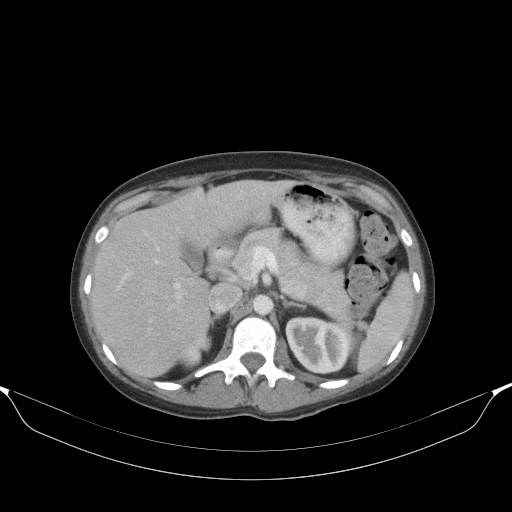
[im 71/92  lung]
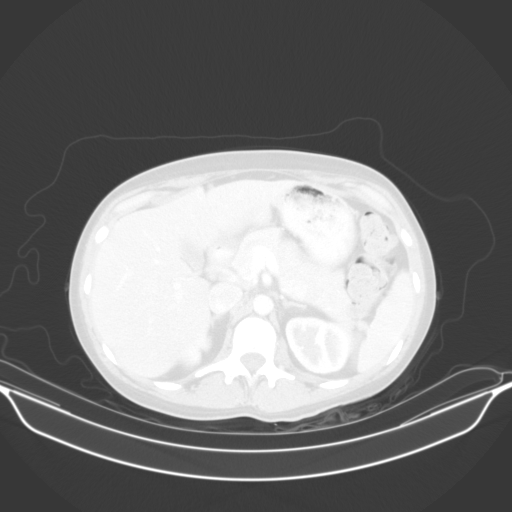
[im 76/92  soft-tissue]
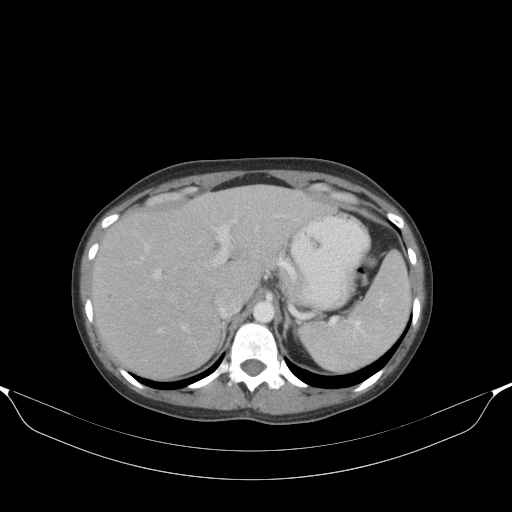
[im 76/92  lung]
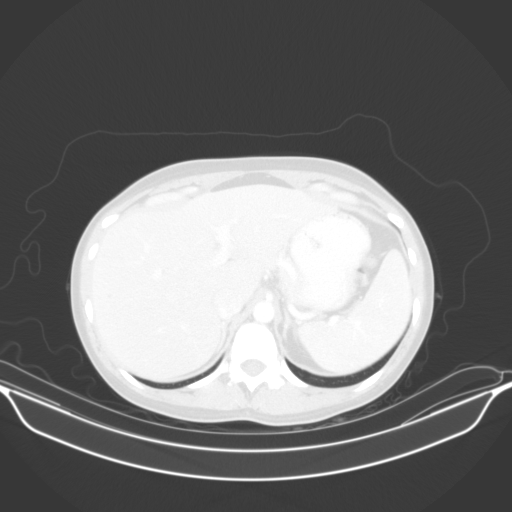
[im 81/92  lung]
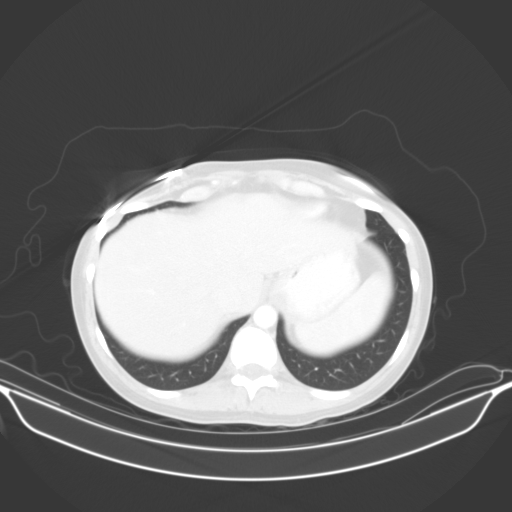
[im 86/92  soft-tissue]
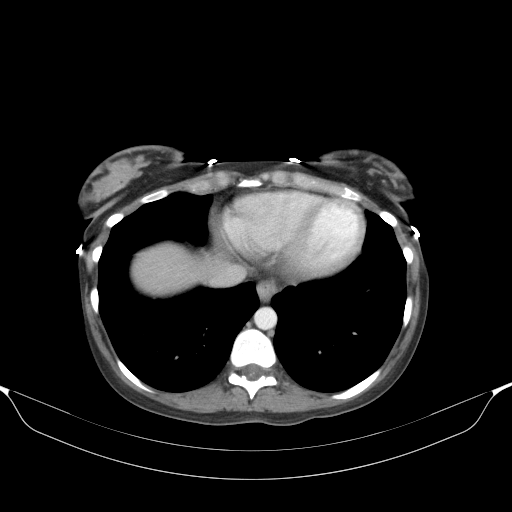
[im 86/92  lung]
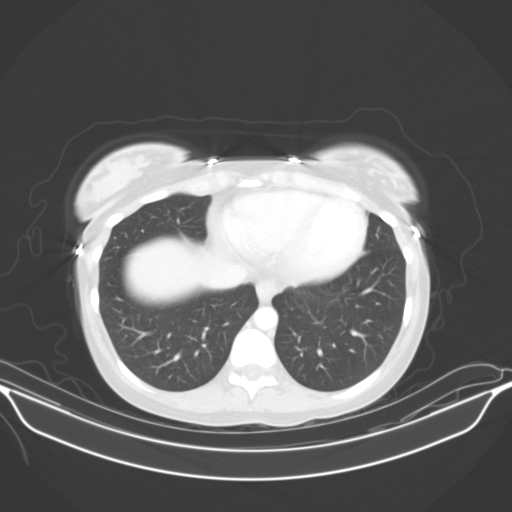

[13 of 32 positions shown; findings below may reference images not displayed]

FINDINGS: Lower chest: No acute abnormality.

Hepatobiliary: No focal liver abnormality is seen. No gallstones,
gallbladder wall thickening, or biliary dilatation.

Pancreas: Unremarkable. No pancreatic ductal dilatation or
surrounding inflammatory changes.

Spleen: Normal in size without focal abnormality.

Adrenals/Urinary Tract: Physiologic hydronephrosis bilaterally,
RIGHT greater than LEFT, due to mass effect by the recently gravid
uterus. No renal or ureteral calculi appreciated. No perinephric
fluid. Bladder is unremarkable, partially decompressed. Adrenal
glands appear normal.

Stomach/Bowel: No dilated large or small bowel loops. Moderate
amount of stool throughout the nondistended colon. Appendix is
normal, partially visualized. Stomach appears normal, partially
decompressed. No bowel wall thickening or evidence of bowel wall
inflammation seen.

Vascular/Lymphatic: No significant vascular findings are present. No
enlarged abdominal or pelvic lymph nodes.

Reproductive: Expected prominence of the uterus status post recent
pregnancy. No adnexal mass or free fluid.

Other: No free fluid or abscess collection. No free intraperitoneal
air.

Musculoskeletal: No acute or suspicious osseous finding.
IMPRESSION: 1. No acute or unexpected findings within the abdomen or pelvis.
Physiologic hydronephrosis bilaterally, RIGHT slightly greater than
LEFT, due to mass effect by the recently gravid uterus. No renal or
ureteral calculi. No perinephric fluid. No bowel obstruction or
evidence of bowel wall inflammation.
2. Moderate amount of stool throughout the nondistended colon.

## 2022-03-17 ENCOUNTER — Other Ambulatory Visit: Payer: Self-pay | Admitting: Obstetrics and Gynecology
